# Patient Record
Sex: Male | Born: 1970 | Race: White | Hispanic: No | Marital: Single | Smoking: Current every day smoker
Health system: Southern US, Community
[De-identification: ages and names within clinical notes are randomized; demographics above are authoritative.]

## PROBLEM LIST (undated history)

## (undated) DIAGNOSIS — E119 Type 2 diabetes mellitus without complications: Secondary | ICD-10-CM

## (undated) DIAGNOSIS — I10 Essential (primary) hypertension: Secondary | ICD-10-CM

## (undated) DIAGNOSIS — T7840XA Allergy, unspecified, initial encounter: Secondary | ICD-10-CM

## (undated) DIAGNOSIS — E785 Hyperlipidemia, unspecified: Secondary | ICD-10-CM

## (undated) DIAGNOSIS — J45909 Unspecified asthma, uncomplicated: Secondary | ICD-10-CM

## (undated) HISTORY — DX: Allergy, unspecified, initial encounter: T78.40XA

## (undated) HISTORY — PX: WRIST SURGERY: SHX841

## (undated) HISTORY — PX: SHOULDER SURGERY: SHX246

## (undated) HISTORY — DX: Unspecified asthma, uncomplicated: J45.909

## (undated) HISTORY — DX: Essential (primary) hypertension: I10

## (undated) HISTORY — DX: Type 2 diabetes mellitus without complications: E11.9

## (undated) HISTORY — DX: Hyperlipidemia, unspecified: E78.5

---

## 2003-03-28 ENCOUNTER — Emergency Department (HOSPITAL_COMMUNITY): Admission: EM | Admit: 2003-03-28 | Discharge: 2003-03-28 | Payer: Self-pay | Admitting: Emergency Medicine

## 2006-03-15 ENCOUNTER — Emergency Department (HOSPITAL_COMMUNITY): Admission: EM | Admit: 2006-03-15 | Discharge: 2006-03-15 | Payer: Self-pay | Admitting: Emergency Medicine

## 2008-08-13 ENCOUNTER — Emergency Department (HOSPITAL_COMMUNITY): Admission: EM | Admit: 2008-08-13 | Discharge: 2008-08-13 | Payer: Self-pay | Admitting: Emergency Medicine

## 2008-08-14 ENCOUNTER — Emergency Department (HOSPITAL_COMMUNITY): Admission: EM | Admit: 2008-08-14 | Discharge: 2008-08-14 | Payer: Self-pay | Admitting: Emergency Medicine

## 2008-10-08 ENCOUNTER — Emergency Department: Payer: Self-pay | Admitting: Unknown Physician Specialty

## 2008-12-09 ENCOUNTER — Inpatient Hospital Stay: Payer: Self-pay | Admitting: Psychiatry

## 2009-03-02 ENCOUNTER — Inpatient Hospital Stay: Payer: Self-pay | Admitting: Psychiatry

## 2010-08-27 ENCOUNTER — Encounter: Payer: Self-pay | Admitting: *Deleted

## 2010-08-27 ENCOUNTER — Emergency Department (HOSPITAL_COMMUNITY)
Admission: EM | Admit: 2010-08-27 | Discharge: 2010-08-27 | Disposition: A | Discharge status: Home / Self Care | Payer: Self-pay | Attending: *Deleted | Admitting: *Deleted

## 2010-08-27 DIAGNOSIS — R109 Unspecified abdominal pain: Secondary | ICD-10-CM | POA: Insufficient documentation

## 2010-08-27 DIAGNOSIS — R1110 Vomiting, unspecified: Secondary | ICD-10-CM | POA: Insufficient documentation

## 2010-08-27 LAB — BASIC METABOLIC PANEL
BUN: 11 mg/dL (ref 6–23)
CO2: 24 mEq/L (ref 19–32)
Chloride: 105 mEq/L (ref 96–112)
Creatinine, Ser: 0.6 mg/dL (ref 0.50–1.35)

## 2010-08-27 LAB — DIFFERENTIAL
Basophils Absolute: 0 10*3/uL (ref 0.0–0.1)
Basophils Relative: 0 % (ref 0–1)
Eosinophils Absolute: 0.1 10*3/uL (ref 0.0–0.7)
Eosinophils Relative: 1 % (ref 0–5)
Lymphocytes Relative: 22 % (ref 12–46)
Monocytes Absolute: 0.4 10*3/uL (ref 0.1–1.0)

## 2010-08-27 LAB — CBC
HCT: 47.1 % (ref 39.0–52.0)
MCH: 32.4 pg (ref 26.0–34.0)
MCHC: 34 g/dL (ref 30.0–36.0)
MCV: 95.3 fL (ref 78.0–100.0)
RDW: 13 % (ref 11.5–15.5)

## 2010-08-27 NOTE — ED Notes (Signed)
Pt witnessed leaving by registration. Pt refused to stop on way out to sign AMA form for [facility] in Registration.

## 2010-08-27 NOTE — ED Notes (Signed)
Pt states vomiting began at 0200 this morning. Pt states he noticed a bite to right lower abdomen this morning.  States he has vomited 25 times today. Pt was lying in waiting room floor when called for triage. Pt also states pain to abdomen. A/o x 4 during triage. No active vomiting since arrival.

## 2010-08-27 NOTE — ED Notes (Signed)
Pt ambulated back out to waiting area without difficulty.

## 2010-10-29 ENCOUNTER — Inpatient Hospital Stay: Payer: Self-pay | Admitting: Psychiatry

## 2012-04-18 IMAGING — CR RIGHT HAND - COMPLETE 3+ VIEW
1 series · 3 of 3 positions shown · non-contrast
Comparison: none

REASON FOR EXAM: edema and pain.
COMMENTS:

[Series 1: view not recorded · 0.17mm/px · 3 of 3 slices shown]
[im 1/3]
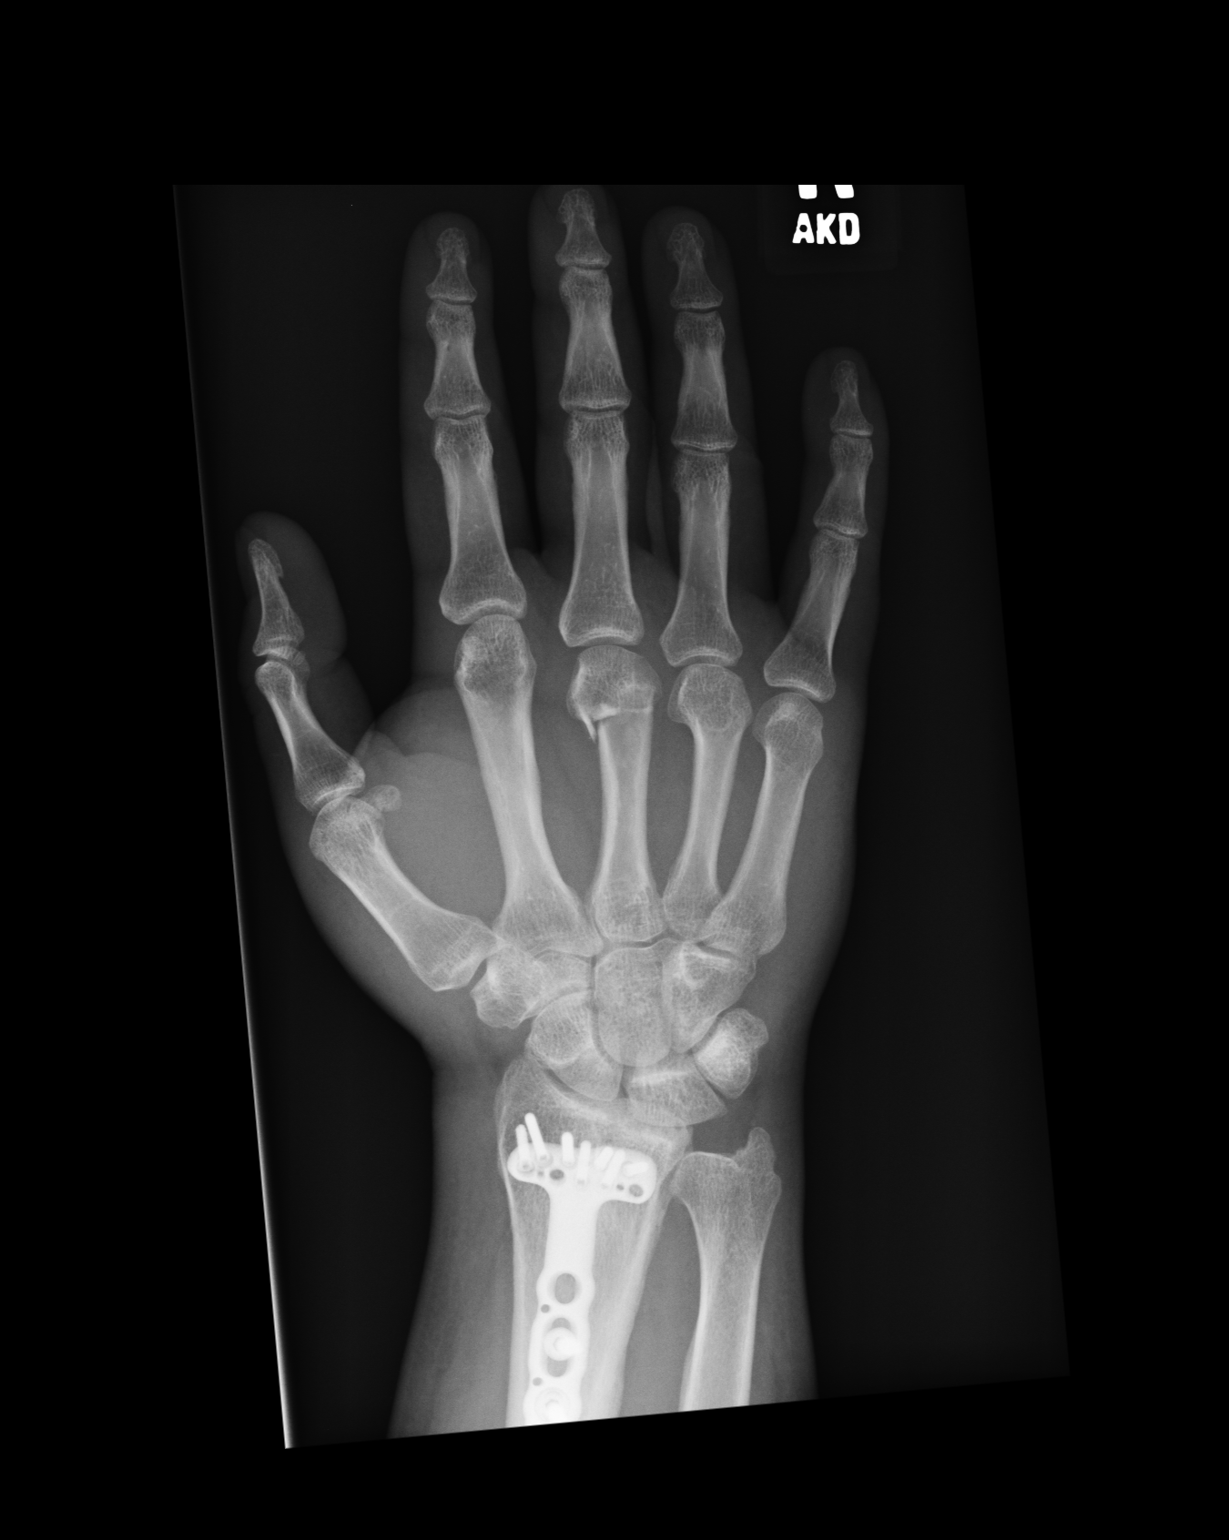
[im 2/3]
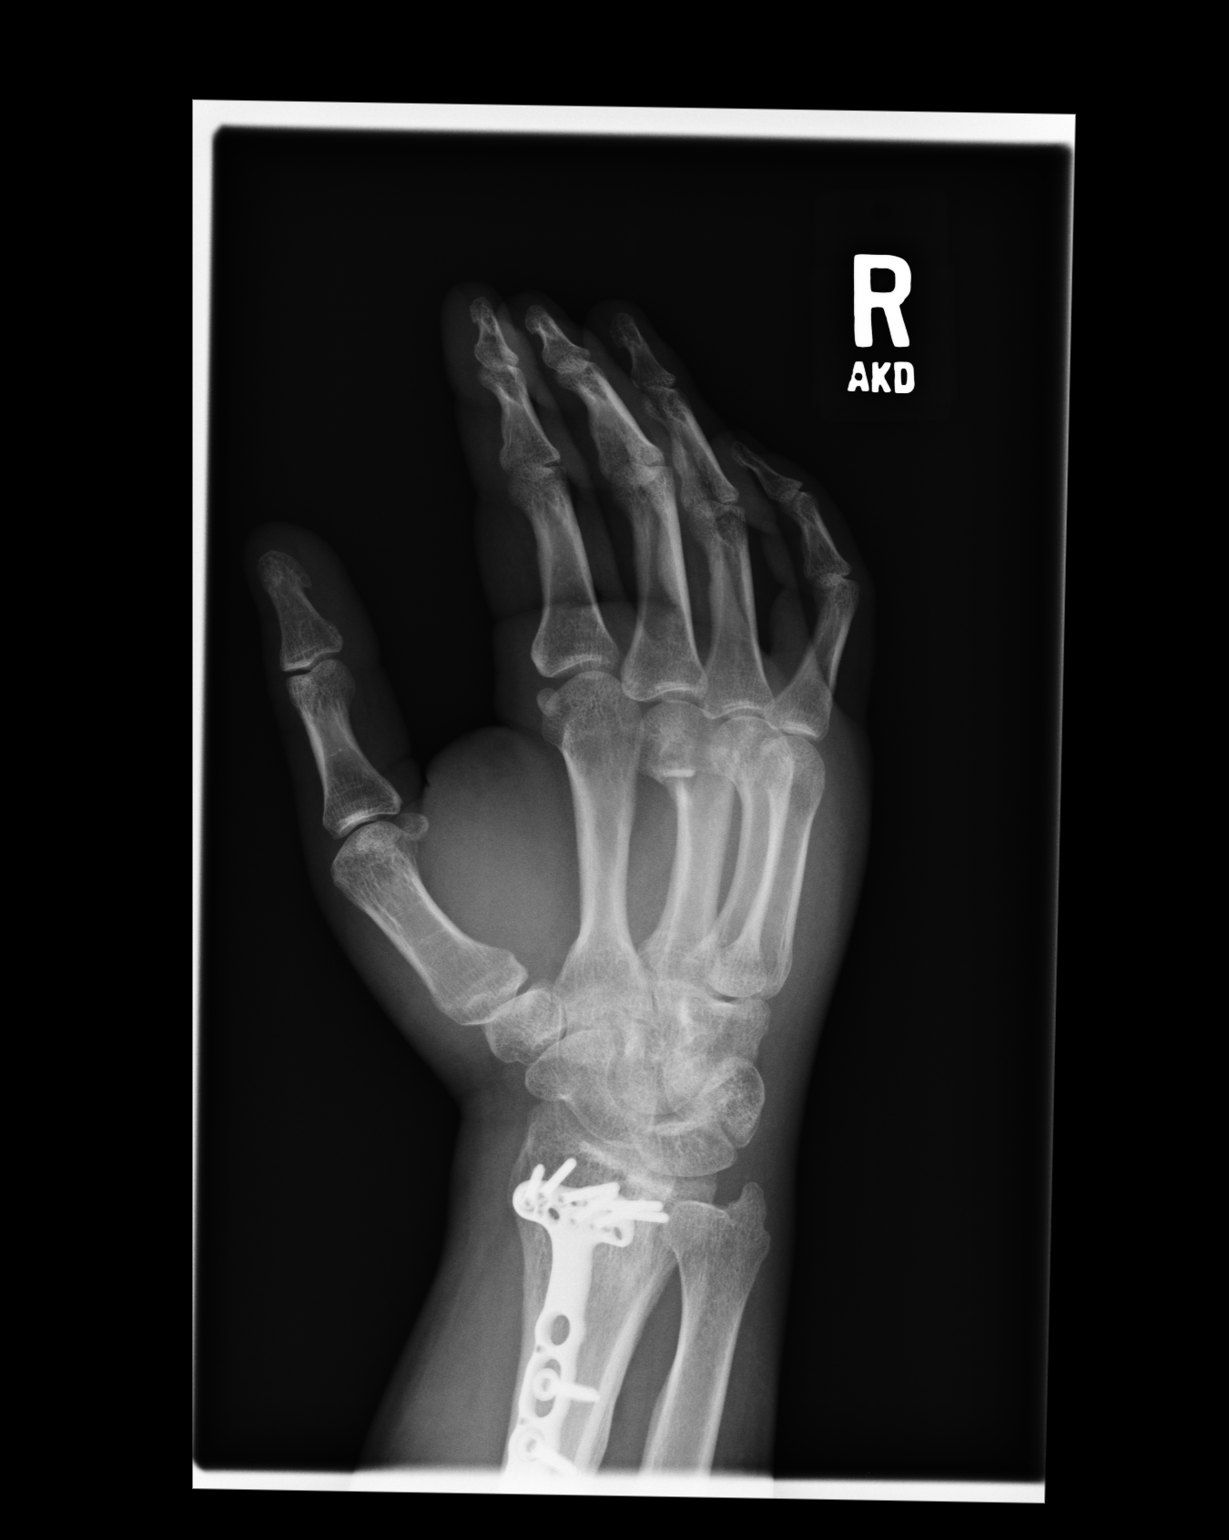
[im 3/3]
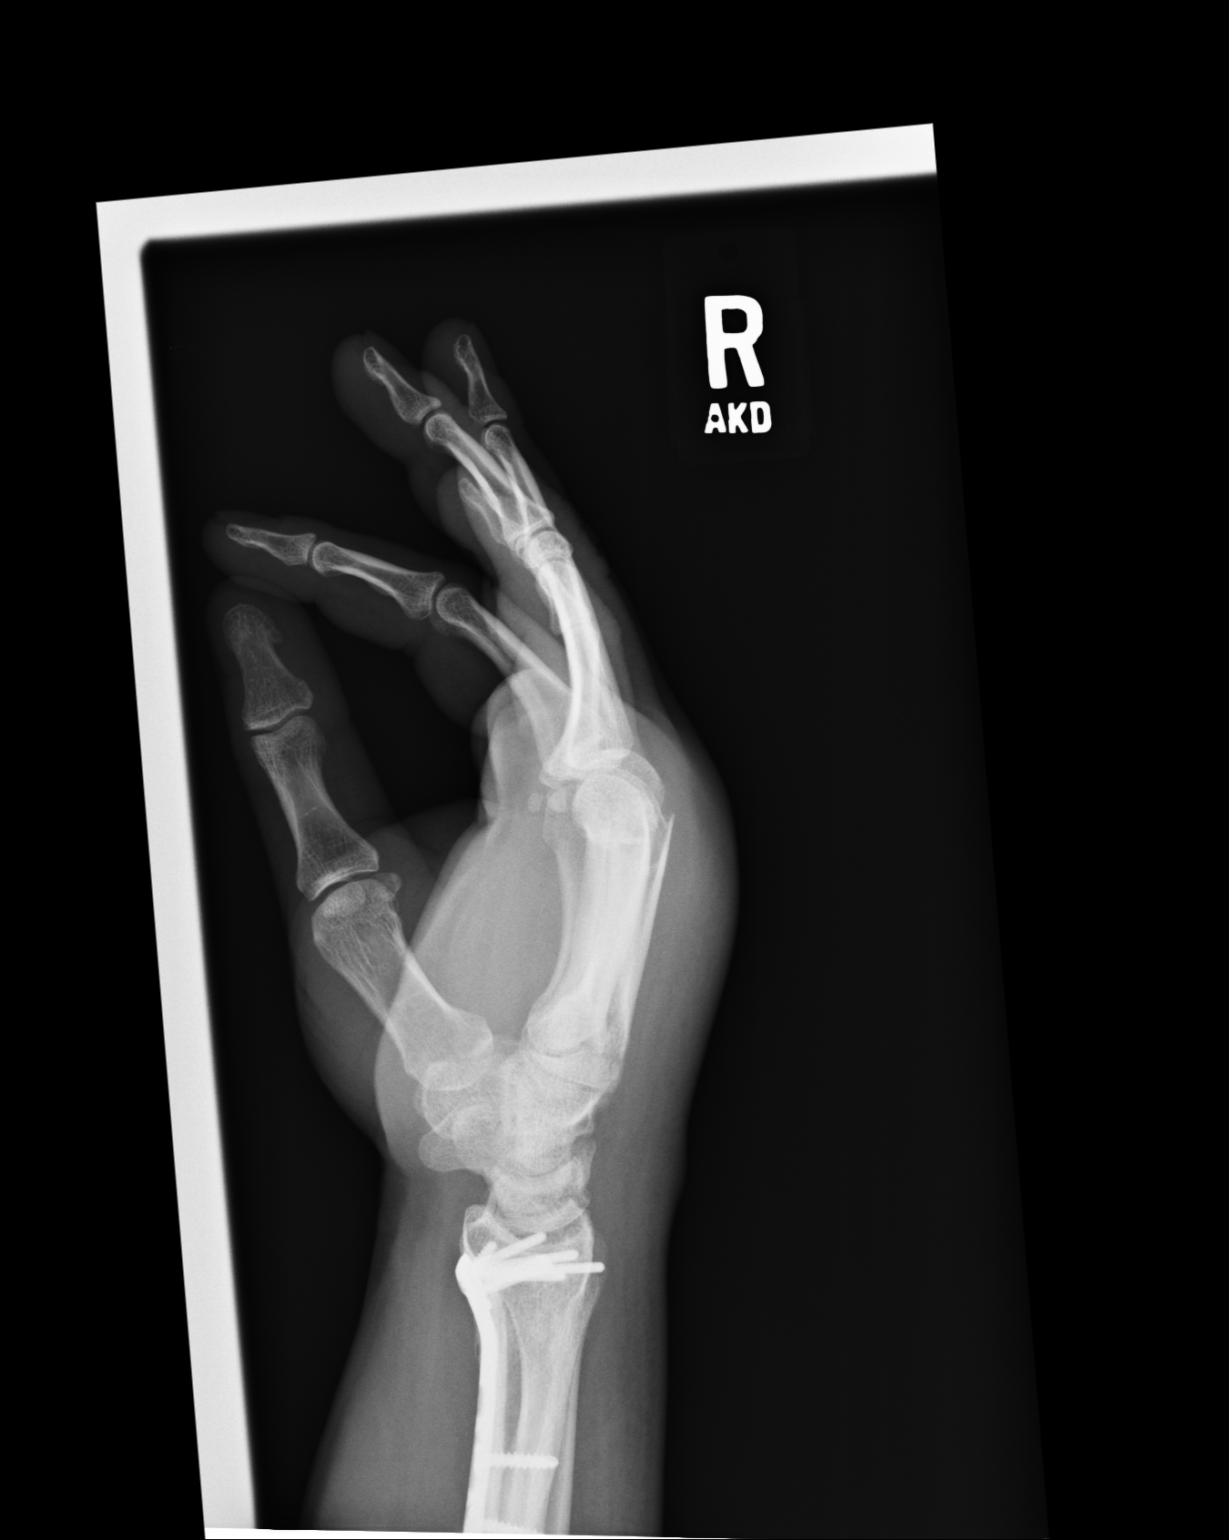

[3 of 3 positions shown; findings below may reference images not displayed]

PROCEDURE:     DXR - DXR HAND RT COMPLETE W/OBLIQUES  - October 28, 2010 [DATE]

RESULT:     There are no priors for comparisons.

A transverse impacted fracture is identified along the distal shaft of the
third metacarpal. There is a component of comminution. Dorsal displacement
is appreciated. Soft tissue swelling is appreciated along the hand. Lateral
buttress plate with screw fixation is identified along the distal radius.
Hardware appears intact without loosening.
IMPRESSION: Transverse fracture involving the shaft of the third
metacarpal, age indeterminate.

## 2019-08-23 ENCOUNTER — Ambulatory Visit (INDEPENDENT_AMBULATORY_CARE_PROVIDER_SITE_OTHER): Payer: Self-pay | Admitting: Internal Medicine

## 2019-08-23 ENCOUNTER — Encounter: Payer: Self-pay | Admitting: Internal Medicine

## 2019-08-23 ENCOUNTER — Other Ambulatory Visit: Payer: Self-pay

## 2019-08-23 VITALS — BP 128/76 | HR 88 | Temp 98.1°F | Ht 67.0 in | Wt 193.0 lb

## 2019-08-23 DIAGNOSIS — E785 Hyperlipidemia, unspecified: Secondary | ICD-10-CM

## 2019-08-23 DIAGNOSIS — F1121 Opioid dependence, in remission: Secondary | ICD-10-CM | POA: Insufficient documentation

## 2019-08-23 DIAGNOSIS — E1169 Type 2 diabetes mellitus with other specified complication: Secondary | ICD-10-CM

## 2019-08-23 DIAGNOSIS — Z8619 Personal history of other infectious and parasitic diseases: Secondary | ICD-10-CM

## 2019-08-23 DIAGNOSIS — I10 Essential (primary) hypertension: Secondary | ICD-10-CM

## 2019-08-23 DIAGNOSIS — K219 Gastro-esophageal reflux disease without esophagitis: Secondary | ICD-10-CM

## 2019-08-23 DIAGNOSIS — E118 Type 2 diabetes mellitus with unspecified complications: Secondary | ICD-10-CM | POA: Insufficient documentation

## 2019-08-23 DIAGNOSIS — N529 Male erectile dysfunction, unspecified: Secondary | ICD-10-CM

## 2019-08-23 MED ORDER — HYDROCHLOROTHIAZIDE 12.5 MG PO CAPS: 12.5000 mg | ORAL_CAPSULE | Freq: Every day | ORAL | 3 refills | Status: DC

## 2019-08-23 MED ORDER — LOSARTAN POTASSIUM 50 MG PO TABS: 50.0000 mg | ORAL_TABLET | Freq: Every day | ORAL | 3 refills | Status: DC

## 2019-08-23 MED ORDER — SILDENAFIL CITRATE 20 MG PO TABS: 20.0000 mg | ORAL_TABLET | Freq: Every day | ORAL | 0 refills | Status: DC | PRN

## 2019-08-23 MED ORDER — OMEPRAZOLE 20 MG PO CPDR: 20.0000 mg | DELAYED_RELEASE_CAPSULE | Freq: Every day | ORAL | 3 refills | Status: DC

## 2019-08-23 MED ORDER — ATORVASTATIN CALCIUM 20 MG PO TABS: 20.0000 mg | ORAL_TABLET | Freq: Every day | ORAL | 3 refills | Status: DC

## 2019-08-23 MED ORDER — METFORMIN HCL 500 MG PO TABS: 1000.0000 mg | ORAL_TABLET | Freq: Every day | ORAL | 3 refills | Status: DC

## 2019-08-23 NOTE — Progress Notes (Signed)
Date:  08/23/2019   Name:  Glenn MayerGary A Gladman   DOB:  02/23/1970   MRN:  [id number]  PT has changed his Medicaid information but does not have his new card.  Chief Complaint: Establish Care (Needs referral for Psych to discuss ADHD - Addera; ), Erectile Dysfunction (Patient wants to discuss medication to help with dysfunction. Wants to test testosterone.), and Diabetes (Needs A1C check. Has not had checked in 2 years. ) Recently released from prison after 9 years.  Living with his mother and has a job in [employer] lined up with a previous employer.  Immunization History  Administered Date(s) Administered  . Moderna SARS-COVID-2 Vaccination 04/12/2019    Erectile Dysfunction This is a new problem. The problem is unchanged. The nature of his difficulty is maintaining erection and penetration. He reports no anxiety. just released from prison and seeing an previous girlfriend. He reports his erection duration to be 1 to 5 minutes. Pertinent negatives include no dysuria or hematuria. Past treatments include nothing.  Diabetes He presents for his follow-up diabetic visit. He has type 2 diabetes mellitus. Pertinent negatives for hypoglycemia include no headaches, nervousness/anxiousness or tremors. Pertinent negatives for diabetes include no chest pain, no fatigue, no polydipsia and no polyuria. Current diabetic treatment includes oral agent (monotherapy). He is compliant with treatment all of the time. An ACE inhibitor/angiotensin II receptor blocker is being taken.  Hyperlipidemia This is a chronic problem. The problem is controlled. Pertinent negatives include no chest pain or shortness of breath. Current antihyperlipidemic treatment includes statins. The current treatment provides significant improvement of lipids.  Hypertension This is a chronic problem. The problem is controlled. Pertinent negatives include no chest pain, headaches, palpitations or shortness of breath. Past treatments include  angiotensin blockers and diuretics. The current treatment provides significant improvement.  ADHD - he reports taking adderall for several years in the past.  He stopped the medication for unknown reasons and now he believes that he really did very well when taking it.  He was able to be productive at work and maintain focus. He is interested in pursuing treatment again.  Lab Results  Component Value Date   CREATININE 0.60 08/27/2010   BUN 11 08/27/2010   NA 141 08/27/2010   K 3.9 08/27/2010   CL 105 08/27/2010   CO2 24 08/27/2010   No results found for: CHOL, HDL, LDLCALC, LDLDIRECT, TRIG, CHOLHDL No results found for: TSH No results found for: HGBA1C Lab Results  Component Value Date   WBC 9.1 08/27/2010   HGB 16.0 08/27/2010   HCT 47.1 08/27/2010   MCV 95.3 08/27/2010   PLT 449 (H) 08/27/2010   No results found for: ALT, AST, GGT, ALKPHOS, BILITOT   Review of Systems  Constitutional: Negative for appetite change, fatigue and unexpected weight change.  HENT: Negative for trouble swallowing.   Eyes: Negative for visual disturbance.  Respiratory: Negative for cough, shortness of breath and wheezing.   Cardiovascular: Negative for chest pain, palpitations and leg swelling.  Gastrointestinal: Negative for abdominal pain and blood in stool.  Endocrine: Negative for polydipsia and polyuria.  Genitourinary: Negative for discharge, dysuria, genital sores, hematuria and testicular pain.       ED  Musculoskeletal: Negative for arthralgias, gait problem and joint swelling.  Skin: Negative for color change and rash.  Allergic/Immunologic: Negative for environmental allergies.  Neurological: Negative for tremors, numbness and headaches.  Psychiatric/Behavioral: Negative for dysphoric mood and sleep disturbance. The patient is not nervous/anxious.  Patient Active Problem List   Diagnosis Date Noted  . Essential hypertension 08/23/2019  . Type II diabetes mellitus with  complication (HCC) 08/23/2019  . Hyperlipidemia associated with type 2 diabetes mellitus (HCC) 08/23/2019  . Erectile dysfunction 08/23/2019  . Gastroesophageal reflux disease 08/23/2019  . Hx of hepatitis C 08/23/2019    Allergies  Allergen Reactions  . Penicillins Anaphylaxis    Past Surgical History:  Procedure Laterality Date  . SHOULDER SURGERY    . WRIST SURGERY      Social History   Tobacco Use  . Smoking status: Former Smoker    Packs/day: 1.00    Years: 25.00    Pack years: 25.00    Types: Cigarettes    Quit date: 2014    Years since quitting: 7.5  . Smokeless tobacco: Never Used  Vaping Use  . Vaping Use: Every day  . Substances: Nicotine, Flavoring  Substance Use Topics  . Alcohol use: No  . Drug use: Not Currently     Medication list has been reviewed and updated.  Current Meds  Medication Sig  . acetaminophen (TYLENOL) 325 MG tablet Take 650 mg by mouth every 6 (six) hours as needed.  Marland Kitchen albuterol (VENTOLIN HFA) 108 (90 Base) MCG/ACT inhaler Inhale into the lungs every 6 (six) hours as needed for wheezing or shortness of breath.  Marland Kitchen aspirin 81 MG chewable tablet Chew by mouth daily.  Marland Kitchen atorvastatin (LIPITOR) 20 MG tablet Take 1 tablet (20 mg total) by mouth daily.  . buprenorphine-naloxone (SUBOXONE) 8-2 mg SUBL SL tablet Place 1 tablet under the tongue daily.  . cetirizine (ZYRTEC) 10 MG tablet Take 10 mg by mouth daily.  . fluticasone (FLONASE) 50 MCG/ACT nasal spray Place into both nostrils daily.  . hydrochlorothiazide (MICROZIDE) 12.5 MG capsule Take 1 capsule (12.5 mg total) by mouth daily.  Marland Kitchen losartan (COZAAR) 50 MG tablet Take 1 tablet (50 mg total) by mouth daily.  . metFORMIN (GLUCOPHAGE) 500 MG tablet Take 2 tablets (1,000 mg total) by mouth daily with breakfast.  . omeprazole (PRILOSEC) 20 MG capsule Take 1 capsule (20 mg total) by mouth daily.  . polycarbophil (FIBERCON) 625 MG tablet Take 625 mg by mouth in the morning and at bedtime.  .  [DISCONTINUED] atorvastatin (LIPITOR) 20 MG tablet Take 20 mg by mouth daily.  . [DISCONTINUED] hydrochlorothiazide (MICROZIDE) 12.5 MG capsule Take 12.5 mg by mouth daily.  . [DISCONTINUED] losartan (COZAAR) 50 MG tablet Take 50 mg by mouth daily.  . [DISCONTINUED] metFORMIN (GLUCOPHAGE) 500 MG tablet Take 1,000 mg by mouth daily with breakfast.  . [DISCONTINUED] omeprazole (PRILOSEC) 20 MG capsule Take 20 mg by mouth daily.    PHQ 2/9 Scores 08/23/2019  PHQ - 2 Score 0  PHQ- 9 Score 0    GAD 7 : Generalized Anxiety Score 08/23/2019  Nervous, Anxious, on Edge 0  Control/stop worrying 0  Worry too much - different things 0  Trouble relaxing 0  Restless 0  Easily annoyed or irritable 0  Afraid - awful might happen 0  Total GAD 7 Score 0  Anxiety Difficulty Not difficult at all    BP Readings from Last 3 Encounters:  08/23/19 128/76  08/27/10 127/86    Physical Exam Vitals and nursing note reviewed.  Constitutional:      General: He is not in acute distress.    Appearance: Normal appearance. He is well-developed.  HENT:     Head: Normocephalic and atraumatic.  Neck:  Vascular: No carotid bruit.  Cardiovascular:     Rate and Rhythm: Normal rate and regular rhythm.     Pulses: Normal pulses.     Heart sounds: No murmur heard.   Pulmonary:     Effort: Pulmonary effort is normal. No respiratory distress.     Breath sounds: No wheezing or rhonchi.  Abdominal:     General: Bowel sounds are normal.     Palpations: Abdomen is soft.     Tenderness: There is no abdominal tenderness.     Hernia: No hernia is present.  Musculoskeletal:        General: Normal range of motion.     Cervical back: Normal range of motion.     Right lower leg: No edema.     Left lower leg: No edema.  Lymphadenopathy:     Cervical: No cervical adenopathy.  Skin:    General: Skin is warm and dry.     Capillary Refill: Capillary refill takes less than 2 seconds.     Findings: No rash.    Neurological:     General: No focal deficit present.     Mental Status: He is alert and oriented to person, place, and time.  Psychiatric:        Mood and Affect: Mood normal.        Behavior: Behavior normal.     Wt Readings from Last 3 Encounters:  08/23/19 193 lb (87.5 kg)  08/27/10 168 lb (76.2 kg)    BP 128/76 (BP Location: Right Arm, Patient Position: Sitting, Cuff Size: Large)   Pulse 88   Temp 98.1 F (36.7 C) (Oral)   Ht 5\' 7"  (1.702 m)   Wt 193 lb (87.5 kg)   SpO2 98%   BMI 30.23 kg/m   Assessment and Plan: 1. Essential hypertension Clinically stable exam with well controlled BP. Tolerating medications without side effects at this time. Pt to continue current regimen and low sodium diet; benefits of regular exercise as able discussed. - losartan (COZAAR) 50 MG tablet; Take 1 tablet (50 mg total) by mouth daily.  Dispense: 30 tablet; Refill: 3 - hydrochlorothiazide (MICROZIDE) 12.5 MG capsule; Take 1 capsule (12.5 mg total) by mouth daily.  Dispense: 30 capsule; Refill: 3 - Comprehensive metabolic panel  2. Type II diabetes mellitus with complication (HCC) Clinically stable by exam and report without s/s of hypoglycemia. DM complicated by htn. Tolerating medications well without side effects or other concerns. - metFORMIN (GLUCOPHAGE) 500 MG tablet; Take 2 tablets (1,000 mg total) by mouth daily with breakfast.  Dispense: 60 tablet; Refill: 3 - Hemoglobin A1c  3. Hyperlipidemia associated with type 2 diabetes mellitus (HCC) Tolerating statin medication without side effects at this time Continue same therapy without change at this time. - atorvastatin (LIPITOR) 20 MG tablet; Take 1 tablet (20 mg total) by mouth daily.  Dispense: 30 tablet; Refill: 3 - Lipid panel  4. Erectile dysfunction, unspecified erectile dysfunction type Prescribed generic sildenafil 20 mg - adjust dose as needed Usual warnings and precautions given - sildenafil (REVATIO) 20 MG tablet;  Take 1-3 tablets (20-60 mg total) by mouth daily as needed.  Dispense: 30 tablet; Refill: 0 - Testosterone  5. Gastroesophageal reflux disease, unspecified whether esophagitis present With hx of gerd and hep C he likely needs an EGD He is also due for a colonoscopy so once he gets his medicaid card, will refer to GI - omeprazole (PRILOSEC) 20 MG capsule; Take 1 capsule (20 mg total) by  mouth daily.  Dispense: 30 capsule; Refill: 3 - CBC with Differential/Platelet  6. Hx of hepatitis C Treated and cured while incarcerated Per pt, several follow up labs were negative for Hep C antigen  7. Narcotic dependence, in remission (HCC) On Suboxone therapy - from a [employer] group He says that he has a form to be signed by PCP then his insurance can be billed for services He will need to show me that form - I have advised him that I do not prescribe Suboxone I also do not prescribe Adderall.   Partially dictated using Animal nutritionist. Any errors are unintentional.  Bari Edward, MD [facility]  08/23/2019

## 2019-08-30 ENCOUNTER — Other Ambulatory Visit: Payer: Self-pay | Admitting: Internal Medicine

## 2019-08-30 ENCOUNTER — Telehealth: Payer: Self-pay

## 2019-08-30 ENCOUNTER — Telehealth: Payer: Self-pay | Admitting: Internal Medicine

## 2019-08-30 DIAGNOSIS — I10 Essential (primary) hypertension: Secondary | ICD-10-CM

## 2019-08-30 DIAGNOSIS — K219 Gastro-esophageal reflux disease without esophagitis: Secondary | ICD-10-CM

## 2019-08-30 DIAGNOSIS — N529 Male erectile dysfunction, unspecified: Secondary | ICD-10-CM

## 2019-08-30 DIAGNOSIS — E118 Type 2 diabetes mellitus with unspecified complications: Secondary | ICD-10-CM

## 2019-08-30 DIAGNOSIS — E785 Hyperlipidemia, unspecified: Secondary | ICD-10-CM

## 2019-08-30 MED ORDER — SILDENAFIL CITRATE 20 MG PO TABS: 20.0000 mg | ORAL_TABLET | Freq: Every day | ORAL | 0 refills | Status: DC | PRN

## 2019-08-30 MED ORDER — METFORMIN HCL 500 MG PO TABS: 1000.0000 mg | ORAL_TABLET | Freq: Every day | ORAL | 3 refills | Status: DC

## 2019-08-30 MED ORDER — ATORVASTATIN CALCIUM 20 MG PO TABS: 20.0000 mg | ORAL_TABLET | Freq: Every day | ORAL | 3 refills | Status: DC

## 2019-08-30 MED ORDER — HYDROCHLOROTHIAZIDE 12.5 MG PO CAPS: 12.5000 mg | ORAL_CAPSULE | Freq: Every day | ORAL | 3 refills | Status: DC

## 2019-08-30 MED ORDER — OMEPRAZOLE 20 MG PO CPDR: 20.0000 mg | DELAYED_RELEASE_CAPSULE | Freq: Every day | ORAL | 3 refills | Status: DC

## 2019-08-30 MED ORDER — LOSARTAN POTASSIUM 50 MG PO TABS: 50.0000 mg | ORAL_TABLET | Freq: Every day | ORAL | 3 refills | Status: DC

## 2019-08-30 NOTE — Telephone Encounter (Signed)
Patient's mother calling to inquire if Dr. Judithann Graves would be able to send a medication for cough and headache. She declined appointment for patient due to patient starting a new job today. Please advise.

## 2019-08-30 NOTE — Telephone Encounter (Signed)
Called and left pt a VM letting him know that he needs to get his labs done per Dr. Judithann Graves. Told him if he has any issues with the lab to give [place] a call because there are two labs he can have his labs drawn at.  CRM created for [facility].  CM

## 2019-08-30 NOTE — Telephone Encounter (Signed)
LVM with patient to that he will need to be seen in the office for his symptoms and if he can not make an appointment I recommend that he walk in to urgent care.

## 2019-08-30 NOTE — Telephone Encounter (Signed)
Noted  KP 

## 2019-08-30 NOTE — Telephone Encounter (Signed)
Medication: atorvastatin (LIPITOR) 20 MG tablet [[id number]] , hydrochlorothiazide (MICROZIDE) 12.5 MG capsule [[id number]] , losartan (COZAAR) 50 MG tablet [[id number]], metFORMIN (GLUCOPHAGE) 500 MG tablet [[id number]] , omeprazole (PRILOSEC) 20 MG capsule [[id number]] , sildenafil (REVATIO) 20 MG tablet [[id number]]   Can the above medication be resent to the below pharmacy please?  Has the patient contacted their pharmacy? Yes  (Agent: If no, request that the patient contact the pharmacy for the refill.) (Agent: If yes, when and what did the pharmacy advise?)  Preferred Pharmacy (with phone number or street name): [facility] [address] [place]. [address] [phone]  Agent: Please be advised that RX refills may take up to 3 business days. We ask that you follow-up with your pharmacy.

## 2019-09-27 ENCOUNTER — Other Ambulatory Visit: Payer: Self-pay

## 2019-09-27 ENCOUNTER — Encounter: Payer: Self-pay | Admitting: Internal Medicine

## 2019-09-27 ENCOUNTER — Ambulatory Visit (INDEPENDENT_AMBULATORY_CARE_PROVIDER_SITE_OTHER): Payer: 59 | Admitting: Internal Medicine

## 2019-09-27 VITALS — BP 126/72 | HR 71 | Temp 98.4°F | Ht 67.0 in | Wt 195.0 lb

## 2019-09-27 DIAGNOSIS — I10 Essential (primary) hypertension: Secondary | ICD-10-CM | POA: Diagnosis not present

## 2019-09-27 DIAGNOSIS — N529 Male erectile dysfunction, unspecified: Secondary | ICD-10-CM

## 2019-09-27 DIAGNOSIS — K5904 Chronic idiopathic constipation: Secondary | ICD-10-CM | POA: Diagnosis not present

## 2019-09-27 DIAGNOSIS — F909 Attention-deficit hyperactivity disorder, unspecified type: Secondary | ICD-10-CM | POA: Diagnosis not present

## 2019-09-27 DIAGNOSIS — F1121 Opioid dependence, in remission: Secondary | ICD-10-CM

## 2019-09-27 DIAGNOSIS — E118 Type 2 diabetes mellitus with unspecified complications: Secondary | ICD-10-CM

## 2019-09-27 DIAGNOSIS — E785 Hyperlipidemia, unspecified: Secondary | ICD-10-CM

## 2019-09-27 DIAGNOSIS — E1169 Type 2 diabetes mellitus with other specified complication: Secondary | ICD-10-CM

## 2019-09-27 MED ORDER — LUBIPROSTONE 24 MCG PO CAPS: 24.0000 ug | ORAL_CAPSULE | Freq: Two times a day (BID) | ORAL | 0 refills | Status: DC

## 2019-09-27 NOTE — Progress Notes (Signed)
Date:  09/27/2019   Name:  Glenn Klein   DOB:  09-18-1970   MRN:  [id number]   Chief Complaint: Referral (psychiatrist to get adderral, [facility] [place] ) and Medication Problem (suboxone making pt constipated since he started it)  Constipation This is a new problem. The current episode started more than 1 month ago. The problem is unchanged. His stool frequency is 2 to 3 times per week. The stool is described as pellet like. The patient is on a high fiber diet. He exercises regularly. There has been adequate water intake. Associated symptoms include bloating. Pertinent negatives include no abdominal pain, fecal incontinence, fever, vomiting or weight loss. Risk factors: since starting suboxone. He has tried enemas, laxatives, fiber and stool softeners for the symptoms. The treatment provided no relief.  Psych referral - wants to see someone about possible ADHD. We discussed this last visit and he aware that I do not prescribe Adderall which is what he taken in the past. Narcotic addiction - he is taking Suboxone from an on-line practice.  He does monthly videos and then picks up the medication at [facility].  The medication is affordable but the visits are costing over $100 per month.  Lab Results  Component Value Date   CREATININE 0.60 08/27/2010   BUN 11 08/27/2010   NA 141 08/27/2010   K 3.9 08/27/2010   CL 105 08/27/2010   CO2 24 08/27/2010   No results found for: CHOL, HDL, LDLCALC, LDLDIRECT, TRIG, CHOLHDL No results found for: TSH No results found for: HGBA1C Lab Results  Component Value Date   WBC 9.1 08/27/2010   HGB 16.0 08/27/2010   HCT 47.1 08/27/2010   MCV 95.3 08/27/2010   PLT 449 (H) 08/27/2010   No results found for: ALT, AST, GGT, ALKPHOS, BILITOT   Review of Systems  Constitutional: Negative for appetite change, fatigue, fever, unexpected weight change and weight loss.  HENT: Negative for trouble swallowing.   Eyes: Negative for visual  disturbance.  Respiratory: Negative for cough, shortness of breath and wheezing.   Cardiovascular: Negative for chest pain, palpitations and leg swelling.  Gastrointestinal: Positive for bloating and constipation. Negative for abdominal pain, blood in stool and vomiting.  Endocrine: Negative for polydipsia and polyuria.  Genitourinary: Negative for discharge, dysuria, genital sores, hematuria and testicular pain.       ED  Musculoskeletal: Negative for arthralgias, gait problem and joint swelling.  Skin: Negative for color change and rash.  Allergic/Immunologic: Negative for environmental allergies.  Neurological: Negative for tremors, numbness and headaches.  Psychiatric/Behavioral: Negative for dysphoric mood and sleep disturbance. The patient is not nervous/anxious.     Patient Active Problem List   Diagnosis Date Noted  . Essential hypertension 08/23/2019  . Type II diabetes mellitus with complication (HCC) 08/23/2019  . Hyperlipidemia associated with type 2 diabetes mellitus (HCC) 08/23/2019  . Erectile dysfunction 08/23/2019  . Gastroesophageal reflux disease 08/23/2019  . Hx of hepatitis C 08/23/2019  . Narcotic dependence, in remission (HCC) 08/23/2019    Allergies  Allergen Reactions  . Penicillins Anaphylaxis    Past Surgical History:  Procedure Laterality Date  . SHOULDER SURGERY    . WRIST SURGERY      Social History   Tobacco Use  . Smoking status: Current Some Day Smoker    Packs/day: 1.00    Years: 25.00    Pack years: 25.00    Types: Cigarettes    Last attempt to quit:  2014    Years since quitting: 7.6  . Smokeless tobacco: Never Used  . Tobacco comment: every once and a while  Vaping Use  . Vaping Use: Every day  . Substances: Nicotine, Flavoring  Substance Use Topics  . Alcohol use: No  . Drug use: Not Currently     Medication list has been reviewed and updated.  Current Meds  Medication Sig  . acetaminophen (TYLENOL) 325 MG tablet Take  650 mg by mouth every 6 (six) hours as needed.  Marland Kitchen albuterol (VENTOLIN HFA) 108 (90 Base) MCG/ACT inhaler Inhale into the lungs every 6 (six) hours as needed for wheezing or shortness of breath.  Marland Kitchen aspirin 81 MG chewable tablet Chew by mouth daily.  Marland Kitchen atorvastatin (LIPITOR) 20 MG tablet Take 1 tablet (20 mg total) by mouth daily.  . buprenorphine-naloxone (SUBOXONE) 8-2 mg SUBL SL tablet Place 1 tablet under the tongue daily.  . cetirizine (ZYRTEC) 10 MG tablet Take 10 mg by mouth daily.  . fluticasone (FLONASE) 50 MCG/ACT nasal spray Place into both nostrils daily.  . hydrochlorothiazide (MICROZIDE) 12.5 MG capsule Take 1 capsule (12.5 mg total) by mouth daily.  Marland Kitchen losartan (COZAAR) 50 MG tablet Take 1 tablet (50 mg total) by mouth daily.  . metFORMIN (GLUCOPHAGE) 500 MG tablet Take 2 tablets (1,000 mg total) by mouth daily with breakfast.  . omeprazole (PRILOSEC) 20 MG capsule Take 1 capsule (20 mg total) by mouth daily.  . Psyllium (METAMUCIL PO) Take 1 tablet by mouth daily.  . sildenafil (REVATIO) 20 MG tablet Take 1-3 tablets (20-60 mg total) by mouth daily as needed.  . [DISCONTINUED] polycarbophil (FIBERCON) 625 MG tablet Take 625 mg by mouth in the morning and at bedtime.    PHQ 2/9 Scores 08/23/2019  PHQ - 2 Score 0  PHQ- 9 Score 0    GAD 7 : Generalized Anxiety Score 08/23/2019  Nervous, Anxious, on Edge 0  Control/stop worrying 0  Worry too much - different things 0  Trouble relaxing 0  Restless 0  Easily annoyed or irritable 0  Afraid - awful might happen 0  Total GAD 7 Score 0  Anxiety Difficulty Not difficult at all    BP Readings from Last 3 Encounters:  09/27/19 126/72  08/23/19 128/76  08/27/10 127/86    Physical Exam Vitals and nursing note reviewed.  Constitutional:      General: He is not in acute distress.    Appearance: He is well-developed.  HENT:     Head: Normocephalic and atraumatic.  Cardiovascular:     Rate and Rhythm: Normal rate and regular  rhythm.     Pulses: Normal pulses.     Heart sounds: No murmur heard.   Pulmonary:     Effort: Pulmonary effort is normal. No respiratory distress.     Breath sounds: No wheezing or rhonchi.  Abdominal:     General: There is distension.     Palpations: Abdomen is soft.     Tenderness: There is no abdominal tenderness. There is no guarding or rebound.  Musculoskeletal:        General: Normal range of motion.     Right lower leg: No edema.     Left lower leg: No edema.  Skin:    General: Skin is warm and dry.     Findings: No rash.  Neurological:     General: No focal deficit present.     Mental Status: He is alert and oriented to person, place,  and time.  Psychiatric:        Mood and Affect: Mood normal.        Behavior: Behavior normal.     Wt Readings from Last 3 Encounters:  09/27/19 195 lb (88.5 kg)  08/23/19 193 lb (87.5 kg)  08/27/10 168 lb (76.2 kg)    BP 126/72   Pulse 71   Temp 98.4 F (36.9 C) (Oral)   Ht 5\' 7"  (1.702 m)   Wt 195 lb (88.5 kg)   SpO2 96%   BMI 30.54 kg/m   Assessment and Plan: 1. Chronic idiopathic constipation He has failed stool softeners, fiber supplements, Metamucil and laxatives. - lubiprostone (AMITIZA) 24 MCG capsule; Take 1 capsule (24 mcg total) by mouth 2 (two) times daily with a meal.  Dispense: 60 capsule; Refill: 0  2. adhd suspected - Ambulatory referral to Psychiatry  3. Narcotic dependence, in remission (HCC) On Suboxone - he wants to find a local clinic for services to save money He says that the [facility] does have suboxone services so will look into that  4. Essential hypertension controlled - CBC with Differential/Platelet - Comprehensive metabolic panel  5. Type II diabetes mellitus with complication (HCC) On oral medications without issues at this time - Hemoglobin A1c  6. Hyperlipidemia associated with type 2 diabetes mellitus (HCC) On statin therapy  7. Erectile dysfunction, unspecified erectile  dysfunction type Hx of low T in the past - return for AM fasting labs - Testosterone   Partially dictated using [facility]. Any errors are unintentional.  Animal nutritionist, MD [facility]  09/27/2019

## 2019-09-28 ENCOUNTER — Telehealth: Payer: Self-pay | Admitting: Internal Medicine

## 2019-09-28 NOTE — Telephone Encounter (Signed)
Copied from CRM [id number]. Topic: Referral - Status >> Sep 28, 2019  9:53 AM Crist Infante wrote: Reason for CRM: [facility] called to let you know they CANNOT accept the pt.  They do not accept [facility] insurance anymore.  She said they received fax yesterday from your office for referral

## 2019-09-28 NOTE — Telephone Encounter (Signed)
Will route note to the referrals team to send to another psychiatrist.

## 2019-09-28 NOTE — Telephone Encounter (Signed)
Forwarded CRM to Maye Hides for referral change.  CM

## 2019-10-06 ENCOUNTER — Telehealth: Payer: Self-pay

## 2019-10-06 NOTE — Telephone Encounter (Signed)
Called and left patient a VM to remind him to have his blood work done from his recent appt. Told him to call back with any questions or concerns.  CM

## 2019-10-09 ENCOUNTER — Other Ambulatory Visit: Payer: Self-pay | Admitting: Internal Medicine

## 2019-10-09 DIAGNOSIS — N529 Male erectile dysfunction, unspecified: Secondary | ICD-10-CM

## 2019-10-09 NOTE — Telephone Encounter (Signed)
Requested Prescriptions  Pending Prescriptions Disp Refills  . sildenafil (REVATIO) 20 MG tablet [Pharmacy Med Name: SILDENAFIL CITRATE 20 MG TAB] 30 tablet 0    Sig: TAKE 1 TO 3 TABLETS EVERY DAY AS NEEDED     Urology: Erectile Dysfunction Agents Passed - 10/09/2019 11:24 AM      Passed - Last BP in normal range    BP Readings from Last 1 Encounters:  09/27/19 126/72         Passed - Valid encounter within last 12 months    Recent Outpatient Visits          1 week ago Chronic idiopathic constipation   [facility] Reubin Milan, MD   1 month ago Essential hypertension   [facility] Reubin Milan, MD

## 2019-10-11 ENCOUNTER — Other Ambulatory Visit: Payer: Self-pay | Admitting: Internal Medicine

## 2019-10-11 ENCOUNTER — Telehealth: Payer: Self-pay | Admitting: Internal Medicine

## 2019-10-11 DIAGNOSIS — N529 Male erectile dysfunction, unspecified: Secondary | ICD-10-CM

## 2019-10-11 MED ORDER — TADALAFIL 20 MG PO TABS: 20.0000 mg | ORAL_TABLET | Freq: Every day | ORAL | 0 refills | Status: DC | PRN

## 2019-10-11 NOTE — Telephone Encounter (Signed)
Please Advise. Last RF for sildenafil was 10/09/2019. Last office visit was 09/27/2019.  KP

## 2019-10-11 NOTE — Telephone Encounter (Signed)
Called pt he said that he has tried taking 5 of the 20 mg sildenafil. He went and got his labs drawn today. Pt requested if you could not  give him a higher dosage that he would also try Viagra 100 mg. Pt stated his insurance doesn't cover it so he has to pay out of pocket.   KP

## 2019-10-11 NOTE — Telephone Encounter (Signed)
Did he try up to 5 of the 20 mg sildenafil?  If not, he does need to do that first.   He also needs to get his blood drawn.

## 2019-10-11 NOTE — Telephone Encounter (Signed)
100 mg is the max dose of viagra and sildenafil.  I can't go any higher.  If he wants to try something else, I can send in Cialis 20 mg (also the max dose)

## 2019-10-11 NOTE — Telephone Encounter (Signed)
Pt wants to try Cialis 20mg  and wants it sent to [facility] in [place].  KP

## 2019-10-11 NOTE — Telephone Encounter (Signed)
Pt was told a different dose or something else could be called in if generic didn't work. He wants a stronger dose and more refills of Viagra and not generic sent to [facility]  sildenafil (REVATIO) 20 MG tablet [[id number]

## 2019-10-12 LAB — CBC WITH DIFFERENTIAL/PLATELET
Basophils Absolute: 0 10*3/uL (ref 0.0–0.2)
Basos: 0 %
EOS (ABSOLUTE): 0.1 10*3/uL (ref 0.0–0.4)
Eos: 1 %
Hematocrit: 42.7 % (ref 37.5–51.0)
Hemoglobin: 14.5 g/dL (ref 13.0–17.7)
Immature Grans (Abs): 0 10*3/uL (ref 0.0–0.1)
Immature Granulocytes: 0 %
Lymphocytes Absolute: 2 10*3/uL (ref 0.7–3.1)
Lymphs: 23 %
MCH: 30.6 pg (ref 26.6–33.0)
MCHC: 34 g/dL (ref 31.5–35.7)
MCV: 90 fL (ref 79–97)
Monocytes Absolute: 0.4 10*3/uL (ref 0.1–0.9)
Monocytes: 5 %
Neutrophils Absolute: 6.2 10*3/uL (ref 1.4–7.0)
Neutrophils: 71 %
Platelets: 305 10*3/uL (ref 150–450)
RBC: 4.74 x10E6/uL (ref 4.14–5.80)
RDW: 12.2 % (ref 11.6–15.4)
WBC: 8.8 10*3/uL (ref 3.4–10.8)

## 2019-10-12 LAB — COMPREHENSIVE METABOLIC PANEL
ALT: 21 IU/L (ref 0–44)
AST: 26 IU/L (ref 0–40)
Albumin/Globulin Ratio: 1.8 (ref 1.2–2.2)
Albumin: 4.6 g/dL (ref 3.8–4.9)
Alkaline Phosphatase: 87 IU/L (ref 48–121)
BUN/Creatinine Ratio: 18 (ref 9–20)
BUN: 14 mg/dL (ref 6–24)
Bilirubin Total: 0.4 mg/dL (ref 0.0–1.2)
CO2: 18 mmol/L — ABNORMAL LOW (ref 20–29)
Calcium: 9.5 mg/dL (ref 8.7–10.2)
Chloride: 101 mmol/L (ref 96–106)
Creatinine, Ser: 0.79 mg/dL (ref 0.76–1.27)
GFR calc Af Amer: 120 mL/min/{1.73_m2} (ref >59)
GFR calc non Af Amer: 104 mL/min/{1.73_m2} (ref >59)
Globulin, Total: 2.6 g/dL (ref 1.5–4.5)
Glucose: 95 mg/dL (ref 65–99)
Potassium: 4.4 mmol/L (ref 3.5–5.2)
Sodium: 142 mmol/L (ref 134–144)
Total Protein: 7.2 g/dL (ref 6.0–8.5)

## 2019-10-12 LAB — TESTOSTERONE: Testosterone: 245 ng/dL — ABNORMAL LOW (ref 264–916)

## 2019-10-12 LAB — HEMOGLOBIN A1C
Est. average glucose Bld gHb Est-mCnc: 128 mg/dL
Hgb A1c MFr Bld: 6.1 % — ABNORMAL HIGH (ref 4.8–5.6)

## 2019-10-19 ENCOUNTER — Telehealth: Payer: Self-pay | Admitting: Internal Medicine

## 2019-10-19 NOTE — Telephone Encounter (Unsigned)
Copied from CRM [id number]. Topic: Quick Communication - See Telephone Encounter >> Oct 19, 2019 11:10 AM Aretta Nip wrote: CRM for notification. See Telephone encounter for: 10/19/19.PT had not received a call back on new referral since [facility] was not accepting ins, Meanwhile has found out about "Prevagil" and was wanting to know if Dr Senaida Lange nurse could ask if maybe Dr B could prescribe that vs having to go to [facility]. FU with pt at [phone]

## 2019-10-20 NOTE — Telephone Encounter (Signed)
Pt is checking to see if dr berglund can write rx for prevagil. Pt is aware a referral was sent to referral team

## 2019-10-20 NOTE — Telephone Encounter (Signed)
Sent message to the referrals team to check on referral for patient.   CM

## 2019-10-24 ENCOUNTER — Other Ambulatory Visit: Payer: Self-pay | Admitting: Internal Medicine

## 2019-10-24 DIAGNOSIS — K5904 Chronic idiopathic constipation: Secondary | ICD-10-CM

## 2019-10-24 NOTE — Telephone Encounter (Signed)
Requested Prescriptions  Pending Prescriptions Disp Refills   lubiprostone (AMITIZA) 24 MCG capsule [Pharmacy Med Name: LUBIPROSTONE CAPSULES] 60 capsule 0    Sig: TAKE 1 CAPSULE(24 MCG) BY MOUTH TWICE DAILY WITH A MEAL     Gastroenterology: Irritable Bowel Syndrome Passed - 10/24/2019  9:00 AM      Passed - Valid encounter within last 12 months    Recent Outpatient Visits          3 weeks ago Chronic idiopathic constipation   [facility] Reubin Milan, MD   2 months ago Essential hypertension   [facility] Reubin Milan, MD

## 2019-10-26 ENCOUNTER — Other Ambulatory Visit: Payer: Self-pay

## 2019-10-26 ENCOUNTER — Other Ambulatory Visit: Payer: 59

## 2019-10-26 ENCOUNTER — Telehealth: Payer: Self-pay

## 2019-10-26 DIAGNOSIS — F909 Attention-deficit hyperactivity disorder, unspecified type: Secondary | ICD-10-CM

## 2019-10-26 DIAGNOSIS — R7989 Other specified abnormal findings of blood chemistry: Secondary | ICD-10-CM

## 2019-10-26 NOTE — Telephone Encounter (Signed)
Realized patient was Dr Karn Cassis patient instead. Sent message to Maye Hides with referrals to review this for the patient.  CM

## 2019-10-26 NOTE — Telephone Encounter (Signed)
Patient came in the office this morning to get his labs drawn for testosterone. He said he was supposed to have 2 referrals sent in for him at his last appt, and he has not heard anything from either referral.  I told him I would let you know. Please advise.    CM

## 2019-10-27 ENCOUNTER — Telehealth: Payer: Self-pay

## 2019-10-27 LAB — TESTOSTERONE: Testosterone: 311 ng/dL (ref 264–916)

## 2019-10-27 NOTE — Telephone Encounter (Signed)
Patient came into the office yesterday and asked if we could check on the psychiatry referral that was sent out at his most recent appt.   Spoke with - Dorcas Mcmurray - with the referrals team and she said the patient can call and schedule his appt with [facility] since the referral was resent yesterday. Called and informed the patient of this. Told him to call and schedule and gave him their office number. He verbalized understanding.   CM

## 2019-10-28 ENCOUNTER — Telehealth: Payer: Self-pay | Admitting: Internal Medicine

## 2019-10-28 NOTE — Telephone Encounter (Unsigned)
Copied from CRM [id number]. Topic: General - Other >> Oct 28, 2019  9:40 AM Dalphine Handing A wrote: [facility] called in regards to patients referral and stated they will not be able to see patient  due to them not accepting [facility]. Please advise

## 2019-10-28 NOTE — Telephone Encounter (Signed)
Please advise what to do next?

## 2019-10-29 ENCOUNTER — Telehealth: Payer: Self-pay | Admitting: Internal Medicine

## 2019-10-29 NOTE — Telephone Encounter (Signed)
He has already tried the 100 mg sildenafil (viagra) and said it did not work,  that is why I gave Cialis.  Now he is saying that cialis does not work for him but Viagra does.  I will not prescribe any further medication.  If his testosterone is low, I will send him to Urology for both problems.

## 2019-10-29 NOTE — Telephone Encounter (Signed)
Please call pt and tell him that changes on med can only be made by his provider and she in not in.

## 2019-10-29 NOTE — Telephone Encounter (Signed)
Patient is calling back to check on the status of the below request. Patient was advised that medication request can take up to 3 business days. Patient states he was going out of town now and wanted the medication.

## 2019-10-29 NOTE — Telephone Encounter (Signed)
Pt called back asking if he good get the rx of the viagra before the end of the day.  He is going out of town and would like to have it with him.  He states the cialis does not work but the viagra does.  [facility] on [address] and [address]

## 2019-10-29 NOTE — Telephone Encounter (Signed)
Patient is calling to report that he has been taking tadalafil (CIALIS) 20 MG tablet [[id number]] for one week with no improvement. Patient reports that he took his friend's Viagra 100mg . Patient noticed immediate results. Patient is requesting a script for Viagra 100mg . Patient reports that he has plans this weekend and would like the script before the end of the day please. Patient took test for Testerone levels. The first test results the Testerone was low, and the second one was normal. But he was not taking the Cialis on the first test. He took the Cialis on the second. Patient wants to know does Dr. want the test retaken? Please advise Cb- [phone] Preferred Pharmacy- [address] [place], [address]

## 2019-11-01 ENCOUNTER — Other Ambulatory Visit: Payer: Self-pay

## 2019-11-01 ENCOUNTER — Other Ambulatory Visit: Payer: Self-pay | Admitting: Internal Medicine

## 2019-11-01 ENCOUNTER — Telehealth: Payer: Self-pay

## 2019-11-01 DIAGNOSIS — F1121 Opioid dependence, in remission: Secondary | ICD-10-CM

## 2019-11-01 DIAGNOSIS — N529 Male erectile dysfunction, unspecified: Secondary | ICD-10-CM

## 2019-11-01 DIAGNOSIS — R7989 Other specified abnormal findings of blood chemistry: Secondary | ICD-10-CM

## 2019-11-01 NOTE — Telephone Encounter (Signed)
Sent message to [facility] with the referrals team to check on referral and to send to either [facility] or [facility].   CM

## 2019-11-01 NOTE — Telephone Encounter (Signed)
error 

## 2019-11-01 NOTE — Telephone Encounter (Signed)
Called pt he is scheduled tomorrow to get his testosterone levels recheck. Told pt if his levels is low we will send a RF Urology. Pt stated he wants a RF sent to pain clinic suboxone. [facility] fax [phone].   KP

## 2019-11-01 NOTE — Telephone Encounter (Signed)
Patient has information to call Glenn Klein and schedule an appt since [facility] also does not take [facility].

## 2019-11-02 ENCOUNTER — Other Ambulatory Visit: Payer: Self-pay

## 2019-11-02 ENCOUNTER — Ambulatory Visit
Admission: EM | Admit: 2019-11-02 | Discharge: 2019-11-02 | Disposition: A | Discharge status: Home / Self Care | Payer: 59 | Attending: Family Medicine | Admitting: Family Medicine

## 2019-11-02 ENCOUNTER — Encounter: Payer: Self-pay | Admitting: Emergency Medicine

## 2019-11-02 DIAGNOSIS — Z760 Encounter for issue of repeat prescription: Secondary | ICD-10-CM | POA: Diagnosis not present

## 2019-11-02 DIAGNOSIS — N529 Male erectile dysfunction, unspecified: Secondary | ICD-10-CM

## 2019-11-02 DIAGNOSIS — M25512 Pain in left shoulder: Secondary | ICD-10-CM

## 2019-11-02 MED ORDER — TRAMADOL HCL 50 MG PO TABS: 50.0000 mg | ORAL_TABLET | Freq: Four times a day (QID) | ORAL | 0 refills | Status: DC | PRN

## 2019-11-02 MED ORDER — SILDENAFIL CITRATE 100 MG PO TABS: 100.0000 mg | ORAL_TABLET | Freq: Every day | ORAL | 0 refills | Status: AC | PRN

## 2019-11-02 MED ORDER — CYCLOBENZAPRINE HCL 10 MG PO TABS: 10.0000 mg | ORAL_TABLET | Freq: Two times a day (BID) | ORAL | 0 refills | Status: DC | PRN

## 2019-11-02 NOTE — ED Triage Notes (Signed)
Patient states that he slipped off ladder x 2 days ago, trying to catch himself pulled the left shoulder. Had surgery on left shoulder in 1988- tendon/muscle repair. wants something for pain until he sees his regular doctor

## 2019-11-02 NOTE — ED Provider Notes (Signed)
[facility]   [id number] 11/02/19 Arrival Time: 1155  VE:LFYBO PAIN  SUBJECTIVE: History from: patient. Glenn Klein is a 49 y.o. male complains of left shoulder pain that began 2 days ago.  Reports that he slipped off of a ladder and caught himself with a stiff left arm.  Reports that he feels like something tore on the inside of his left shoulder.  Reports that he has history of an old injury that had to be surgically repaired.  Cannot recall what the injury was.  Localizes the pain to the anterior aspect of the left shoulder.  Describes the pain as constant and achy in character.  Has tried OTC medications without relief. Symptoms are made worse with activity. Denies similar symptoms in the past.  Denies fever, chills, erythema, ecchymosis, effusion, weakness, numbness and tingling, saddle paresthesias, loss of bowel or bladder function.      ROS: As per HPI.  All other pertinent ROS negative.     Past Medical History:  Diagnosis Date   Allergy    Asthma    Diabetes mellitus without complication (HCC)    Hyperlipidemia    Hypertension    Past Surgical History:  Procedure Laterality Date   SHOULDER SURGERY     WRIST SURGERY     Allergies  Allergen Reactions   Penicillins Anaphylaxis   No current facility-administered medications on file prior to encounter.   Current Outpatient Medications on File Prior to Encounter  Medication Sig Dispense Refill   acetaminophen (TYLENOL) 325 MG tablet Take 650 mg by mouth every 6 (six) hours as needed.     albuterol (VENTOLIN HFA) 108 (90 Base) MCG/ACT inhaler Inhale into the lungs every 6 (six) hours as needed for wheezing or shortness of breath.     aspirin 81 MG chewable tablet Chew by mouth daily.     atorvastatin (LIPITOR) 20 MG tablet Take 1 tablet (20 mg total) by mouth daily. 30 tablet 3   buprenorphine-naloxone (SUBOXONE) 8-2 mg SUBL SL tablet Place 1 tablet under the tongue daily.     cetirizine (ZYRTEC) 10  MG tablet Take 10 mg by mouth daily.     fluticasone (FLONASE) 50 MCG/ACT nasal spray Place into both nostrils daily.     hydrochlorothiazide (MICROZIDE) 12.5 MG capsule Take 1 capsule (12.5 mg total) by mouth daily. 30 capsule 3   losartan (COZAAR) 50 MG tablet Take 1 tablet (50 mg total) by mouth daily. 30 tablet 3   lubiprostone (AMITIZA) 24 MCG capsule TAKE 1 CAPSULE(24 MCG) BY MOUTH TWICE DAILY WITH A MEAL 60 capsule 0   metFORMIN (GLUCOPHAGE) 500 MG tablet Take 2 tablets (1,000 mg total) by mouth daily with breakfast. 60 tablet 3   omeprazole (PRILOSEC) 20 MG capsule Take 1 capsule (20 mg total) by mouth daily. 30 capsule 3   Psyllium (METAMUCIL PO) Take 1 tablet by mouth daily.     Social History   Socioeconomic History   Marital status: Single    Spouse name: Not on file   Number of children: Not on file   Years of education: Not on file   Highest education level: Not on file  Occupational History   Not on file  Tobacco Use   Smoking status: Current Some Day Smoker    Packs/day: 1.00    Years: 25.00    Pack years: 25.00    Types: Cigarettes    Last attempt to quit: 2014    Years since quitting: 7.7  Smokeless tobacco: Never Used   Tobacco comment: every once and a while  Vaping Use   Vaping Use: Every day   Substances: Nicotine, Flavoring  Substance and Sexual Activity   Alcohol use: No   Drug use: Not Currently   Sexual activity: Yes  Other Topics Concern   Not on file  Social History Narrative   Not on file   Social Determinants of Health   Financial Resource Strain:    Difficulty of Paying Living Expenses: Not on file  Food Insecurity:    Worried About Running Out of Food in the Last Year: Not on file   Ran Out of Food in the Last Year: Not on file  Transportation Needs:    Lack of Transportation (Medical): Not on file   Lack of Transportation (Non-Medical): Not on file  Physical Activity:    Days of Exercise per Week: Not on  file   Minutes of Exercise per Session: Not on file  Stress:    Feeling of Stress : Not on file  Social Connections:    Frequency of Communication with Friends and Family: Not on file   Frequency of Social Gatherings with Friends and Family: Not on file   Attends Religious Services: Not on file   Active Member of Clubs or Organizations: Not on file   Attends Banker Meetings: Not on file   Marital Status: Not on file  Intimate Partner Violence:    Fear of Current or Ex-Partner: Not on file   Emotionally Abused: Not on file   Physically Abused: Not on file   Sexually Abused: Not on file   Family History  Problem Relation Age of Onset   Hyperlipidemia Mother    Heart disease Mother    Colon polyps Mother    Heart disease Father    Colon polyps Maternal Grandmother    Heart disease Paternal Grandmother    Stroke Paternal Grandmother     OBJECTIVE:  Vitals:   11/02/19 1226  BP: 132/89  Pulse: 63  Resp: 16  Temp: 97.9 F (36.6 C)  SpO2: 98%    General appearance: ALERT; in no acute distress.  Head: NCAT Lungs: Normal respiratory effort CV: pulses 2+ bilaterally. Cap refill < 2 seconds Musculoskeletal:  Inspection: Skin warm, dry, clear and intact without obvious erythema, effusion, or ecchymosis.  Palpation: Anterior aspect of left shoulder tender to palpation ROM: Limited ROM active and passive to left shoulder Skin: warm and dry Neurologic: Ambulates without difficulty; Sensation intact about the upper/ lower extremities Psychological: alert and cooperative; normal mood and affect  DIAGNOSTIC STUDIES:  No results found.   ASSESSMENT & PLAN:  1. Acute pain of left shoulder   2. Medication refill   3. Erectile dysfunction, unspecified erectile dysfunction type      Meds ordered this encounter  Medications   sildenafil (VIAGRA) 100 MG tablet    Sig: Take 1 tablet (100 mg total) by mouth daily as needed for erectile  dysfunction.    Dispense:  10 tablet    Refill:  0    Order Specific Question:   Supervising Provider    Answer:   Merrilee Jansky [[id number]]   DISCONTD: traMADol (ULTRAM) 50 MG tablet    Sig: Take 1 tablet (50 mg total) by mouth every 6 (six) hours as needed.    Dispense:  15 tablet    Refill:  0    Order Specific Question:   Supervising Provider    Answer:  LAMPTEY, PHILIP O [[id number]]   cyclobenzaprine (FLEXERIL) 10 MG tablet    Sig: Take 1 tablet (10 mg total) by mouth 2 (two) times daily as needed for muscle spasms.    Dispense:  20 tablet    Refill:  0    Order Specific Question:   Supervising Provider    Answer:   Merrilee Jansky [[id number]]   Refilled Viagra Printed tramadol prescription Flexeril sent to pharmacy Continue conservative management of rest, ice, and gentle stretches Take ibuprofen as needed for pain relief (may cause abdominal discomfort, ulcers, and GI bleeds avoid taking with other NSAIDs) Take cyclobenzaprine at nighttime for symptomatic relief. Avoid driving or operating heavy machinery while using medication. Follow up with PCP if symptoms persist Return or go to the ER if you have any new or worsening symptoms (fever, chills, chest pain, abdominal pain, changes in bowel or bladder habits, pain radiating into lower legs)   [place] Controlled Substances Registry consulted for this patient. I feel the risk/benefit ratio today is favorable for proceeding with this prescription for a controlled substance. Medication sedation precautions given.  Reviewed expectations re: course of current medical issues. Questions answered. Outlined signs and symptoms indicating need for more acute intervention. Patient verbalized understanding. After Visit Summary given.       Moshe Cipro, NP 11/02/19 1524

## 2019-11-02 NOTE — Discharge Instructions (Addendum)
I have sent in Viagra 100mg  for you to take 30 minutes prior to sexual activity as needed  Take ibuprofen as needed.  Rest and elevate your arm.  Apply ice packs 2-3 times a day for up to 20 minutes each.     Printed tramadol prescription  Prescribed flexeril as needed  Follow up with your primary care provider or an orthopedist if you symptoms continue or worsen;  Or if you develop new symptoms, such as numbness, tingling, or weakness.

## 2019-11-04 ENCOUNTER — Other Ambulatory Visit: Payer: Self-pay

## 2019-11-04 NOTE — Progress Notes (Signed)
Error

## 2019-12-09 ENCOUNTER — Other Ambulatory Visit: Payer: Self-pay | Admitting: Internal Medicine

## 2019-12-09 DIAGNOSIS — I10 Essential (primary) hypertension: Secondary | ICD-10-CM

## 2019-12-09 NOTE — Telephone Encounter (Signed)
Requested Prescriptions  Pending Prescriptions Disp Refills  . hydrochlorothiazide (MICROZIDE) 12.5 MG capsule [Pharmacy Med Name: HYDROCHLOROTHIAZIDE 12.5 MG CAP] 90 capsule 0    Sig: TAKE 1 CAPSULE EVERY DAY     Cardiovascular: Diuretics - Thiazide Passed - 12/09/2019 12:29 PM      Passed - Ca in normal range and within 360 days    Calcium  Date Value Ref Range Status  10/11/2019 9.5 8.7 - 10.2 mg/dL Final         Passed - Cr in normal range and within 360 days    Creatinine, Ser  Date Value Ref Range Status  10/11/2019 0.79 0.76 - 1.27 mg/dL Final         Passed - K in normal range and within 360 days    Potassium  Date Value Ref Range Status  10/11/2019 4.4 3.5 - 5.2 mmol/L Final         Passed - Na in normal range and within 360 days    Sodium  Date Value Ref Range Status  10/11/2019 142 134 - 144 mmol/L Final         Passed - Last BP in normal range    BP Readings from Last 1 Encounters:  11/02/19 132/89         Passed - Valid encounter within last 6 months    Recent Outpatient Visits          2 months ago Chronic idiopathic constipation   [facility] Reubin Milan, MD   3 months ago Essential hypertension   [facility] Reubin Milan, MD             . losartan (COZAAR) 50 MG tablet [Pharmacy Med Name: LOSARTAN POTASSIUM 50 MG TAB] 90 tablet 0    Sig: TAKE ONE TABLET EVERY DAY     Cardiovascular:  Angiotensin Receptor Blockers Passed - 12/09/2019 12:29 PM      Passed - Cr in normal range and within 180 days    Creatinine, Ser  Date Value Ref Range Status  10/11/2019 0.79 0.76 - 1.27 mg/dL Final         Passed - K in normal range and within 180 days    Potassium  Date Value Ref Range Status  10/11/2019 4.4 3.5 - 5.2 mmol/L Final         Passed - Patient is not pregnant      Passed - Last BP in normal range    BP Readings from Last 1 Encounters:  11/02/19 132/89         Passed - Valid encounter within last 6  months    Recent Outpatient Visits          2 months ago Chronic idiopathic constipation   [facility] Reubin Milan, MD   3 months ago Essential hypertension   [facility] Reubin Milan, MD

## 2019-12-14 ENCOUNTER — Other Ambulatory Visit: Payer: Self-pay | Admitting: Internal Medicine

## 2019-12-14 DIAGNOSIS — E1169 Type 2 diabetes mellitus with other specified complication: Secondary | ICD-10-CM

## 2019-12-14 DIAGNOSIS — E118 Type 2 diabetes mellitus with unspecified complications: Secondary | ICD-10-CM

## 2019-12-14 DIAGNOSIS — K219 Gastro-esophageal reflux disease without esophagitis: Secondary | ICD-10-CM

## 2019-12-14 NOTE — Telephone Encounter (Signed)
Requested Prescriptions  Pending Prescriptions Disp Refills  . atorvastatin (LIPITOR) 20 MG tablet [Pharmacy Med Name: ATORVASTATIN CALCIUM 20 MG TAB] 30 tablet     Sig: TAKE ONE TABLET EVERY DAY     Cardiovascular:  Antilipid - Statins Failed - 12/14/2019 10:50 AM      Failed - Total Cholesterol in normal range and within 360 days    No results found for: CHOL, POCCHOL, CHOLTOT       Failed - LDL in normal range and within 360 days    No results found for: LDLCALC, LDLC, HIRISKLDL, POCLDL, LDLDIRECT, REALLDLC, TOTLDLC       Failed - HDL in normal range and within 360 days    No results found for: HDL, POCHDL       Failed - Triglycerides in normal range and within 360 days    No results found for: TRIG, POCTRIG       Passed - Patient is not pregnant      Passed - Valid encounter within last 12 months    Recent Outpatient Visits          2 months ago Chronic idiopathic constipation   [facility] Glean Hess, MD   3 months ago Essential hypertension   [facility] Glean Hess, MD             . metFORMIN (GLUCOPHAGE) 500 MG tablet [Pharmacy Med Name: METFORMIN HCL 500 MG TAB] 180 tablet 1    Sig: TAKE TWO TABLETS [place]     Endocrinology:  Diabetes - Biguanides Passed - 12/14/2019 10:50 AM      Passed - Cr in normal range and within 360 days    Creatinine, Ser  Date Value Ref Range Status  10/11/2019 0.79 0.76 - 1.27 mg/dL Final         Passed - HBA1C is between 0 and 7.9 and within 180 days    Hgb A1c MFr Bld  Date Value Ref Range Status  10/11/2019 6.1 (H) 4.8 - 5.6 % Final    Comment:             Prediabetes: 5.7 - 6.4          Diabetes: >6.4          Glycemic control for adults with diabetes: <7.0          Passed - eGFR in normal range and within 360 days    GFR calc Af Amer  Date Value Ref Range Status  10/11/2019 120 >59 mL/min/1.73 Final    Comment:    **[facility] currently reports eGFR in compliance with  the current**   recommendations of the [employer]. [facility] will   update reporting as new guidelines are published from the NKF-ASN   Task force.    GFR calc non Af Amer  Date Value Ref Range Status  10/11/2019 104 >59 mL/min/1.73 Final         Passed - Valid encounter within last 6 months    Recent Outpatient Visits          2 months ago Chronic idiopathic constipation   [facility] Glean Hess, MD   3 months ago Essential hypertension   [place], [place]             . omeprazole (PRILOSEC) 20 MG capsule [Pharmacy Med Name: OMEPRAZOLE DR 20 MG CAP] 90 capsule 0    Sig: TAKE 1  CAPSULE EVERY DAY     Gastroenterology: Proton Pump Inhibitors Passed - 12/14/2019 10:50 AM      Passed - Valid encounter within last 12 months    Recent Outpatient Visits          2 months ago Chronic idiopathic constipation   [facility] Glean Hess, MD   3 months ago Essential hypertension   [facility] Glean Hess, MD

## 2019-12-14 NOTE — Telephone Encounter (Signed)
Requested medication (s) are due for refill today: yes  Requested medication (s) are on the active medication list: yes  Last refill:  08/30/19  Future visit scheduled: no  Notes to clinic:  overdue lab work   Requested Prescriptions  Pending Prescriptions Disp Refills   atorvastatin (LIPITOR) 20 MG tablet [Pharmacy Med Name: ATORVASTATIN CALCIUM 20 MG TAB] 30 tablet     Sig: TAKE ONE TABLET EVERY DAY      Cardiovascular:  Antilipid - Statins Failed - 12/14/2019 10:50 AM      Failed - Total Cholesterol in normal range and within 360 days    No results found for: CHOL, POCCHOL, CHOLTOT        Failed - LDL in normal range and within 360 days    No results found for: LDLCALC, LDLC, HIRISKLDL, POCLDL, LDLDIRECT, REALLDLC, TOTLDLC        Failed - HDL in normal range and within 360 days    No results found for: HDL, POCHDL        Failed - Triglycerides in normal range and within 360 days    No results found for: TRIG, POCTRIG        Passed - Patient is not pregnant      Passed - Valid encounter within last 12 months    Recent Outpatient Visits           2 months ago Chronic idiopathic constipation   [facility] Glean Hess, MD   3 months ago Essential hypertension   [facility] Glean Hess, MD               Signed Prescriptions Disp Refills   metFORMIN (GLUCOPHAGE) 500 MG tablet 180 tablet 1    Sig: TAKE TWO TABLETS [place]      Endocrinology:  Diabetes - Biguanides Passed - 12/14/2019 10:50 AM      Passed - Cr in normal range and within 360 days    Creatinine, Ser  Date Value Ref Range Status  10/11/2019 0.79 0.76 - 1.27 mg/dL Final          Passed - HBA1C is between 0 and 7.9 and within 180 days    Hgb A1c MFr Bld  Date Value Ref Range Status  10/11/2019 6.1 (H) 4.8 - 5.6 % Final    Comment:             Prediabetes: 5.7 - 6.4          Diabetes: >6.4          Glycemic control for adults with diabetes:  <7.0           Passed - eGFR in normal range and within 360 days    GFR calc Af Amer  Date Value Ref Range Status  10/11/2019 120 >59 mL/min/1.73 Final    Comment:    **[facility] currently reports eGFR in compliance with the current**   recommendations of the [employer]. [facility] will   update reporting as new guidelines are published from the NKF-ASN   Task force.    GFR calc non Af Amer  Date Value Ref Range Status  10/11/2019 104 >59 mL/min/1.73 Final          Passed - Valid encounter within last 6 months    Recent Outpatient Visits           2 months ago Chronic idiopathic constipation   [facility] Glean Hess, MD  3 months ago Essential hypertension   [facility] Glean Hess, MD                omeprazole (PRILOSEC) 20 MG capsule 90 capsule 0    Sig: TAKE [place] DAY      Gastroenterology: Proton Pump Inhibitors Passed - 12/14/2019 10:50 AM      Passed - Valid encounter within last 12 months    Recent Outpatient Visits           2 months ago Chronic idiopathic constipation   [facility] Glean Hess, MD   3 months ago Essential hypertension   [facility] Glean Hess, MD

## 2019-12-20 ENCOUNTER — Telehealth: Payer: Self-pay | Admitting: Internal Medicine

## 2019-12-20 ENCOUNTER — Encounter: Payer: Self-pay | Admitting: Urology

## 2019-12-20 ENCOUNTER — Ambulatory Visit (INDEPENDENT_AMBULATORY_CARE_PROVIDER_SITE_OTHER): Payer: 59 | Admitting: Urology

## 2019-12-20 ENCOUNTER — Other Ambulatory Visit: Payer: Self-pay

## 2019-12-20 VITALS — BP 127/81 | HR 91 | Ht 67.0 in | Wt 186.0 lb

## 2019-12-20 DIAGNOSIS — N5201 Erectile dysfunction due to arterial insufficiency: Secondary | ICD-10-CM

## 2019-12-20 DIAGNOSIS — N529 Male erectile dysfunction, unspecified: Secondary | ICD-10-CM

## 2019-12-20 DIAGNOSIS — F524 Premature ejaculation: Secondary | ICD-10-CM | POA: Diagnosis not present

## 2019-12-20 MED ORDER — NONFORMULARY OR COMPOUNDED ITEM: 0 refills | Status: DC

## 2019-12-20 NOTE — Patient Instructions (Addendum)
Erectile Dysfunction Erectile dysfunction (ED) is the inability to get or keep an erection in order to have sexual intercourse. Erectile dysfunction may include:  Inability to get an erection.  Lack of enough hardness of the erection to allow penetration.  Loss of the erection before sex is finished. What are the causes? This condition may be caused by:  Certain medicines, such as: ? Pain relievers. ? Antihistamines. ? Antidepressants. ? Blood pressure medicines. ? Water pills (diuretics). ? Ulcer medicines. ? Muscle relaxants. ? Drugs.  Excessive drinking.  Psychological causes, such as: ? Anxiety. ? Depression. ? Sadness. ? Exhaustion. ? Performance fear. ? Stress.  Physical causes, such as: ? Artery problems. This may include diabetes, smoking, liver disease, or atherosclerosis. ? High blood pressure. ? Hormonal problems, such as low testosterone. ? Obesity. ? Nerve problems. This may include back or pelvic injuries, diabetes mellitus, multiple sclerosis, or Parkinson disease. What are the signs or symptoms? Symptoms of this condition include:  Inability to get an erection.  Lack of enough hardness of the erection to allow penetration.  Loss of the erection before sex is finished.  Normal erections at some times, but with frequent unsatisfactory episodes.  Low sexual satisfaction in either partner due to erection problems.  A curved penis occurring with erection. The curve may cause pain or the penis may be too curved to allow for intercourse.  Never having nighttime erections. How is this diagnosed? This condition is often diagnosed by:  Performing a physical exam to find other diseases or specific problems with the penis.  Asking you detailed questions about the problem.  Performing blood tests to check for diabetes mellitus or to measure hormone levels.  Performing other tests to check for underlying health conditions.  Performing an ultrasound  exam to check for scarring.  Performing a test to check blood flow to the penis.  Doing a sleep study at home to measure nighttime erections. How is this treated? This condition may be treated by:  Medicine taken by mouth to help you achieve an erection (oral medicine).  Hormone replacement therapy to replace low testosterone levels.  Medicine that is injected into the penis. Your health care provider may instruct you how to give yourself these injections at home.  Vacuum pump. This is a pump with a ring on it. The pump and ring are placed on the penis and used to create pressure that helps the penis become erect.  Penile implant surgery. In this procedure, you may receive: ? An inflatable implant. This consists of cylinders, a pump, and a reservoir. The cylinders can be inflated with a fluid that helps to create an erection, and they can be deflated after intercourse. ? A semi-rigid implant. This consists of two silicone rubber rods. The rods provide some rigidity. They are also flexible, so the penis can both curve downward in its normal position and become straight for sexual intercourse.  Blood vessel surgery, to improve blood flow to the penis. During this procedure, a blood vessel from a different part of the body is placed into the penis to allow blood to flow around (bypass) damaged or blocked blood vessels.  Lifestyle changes, such as exercising more, losing weight, and quitting smoking. Follow these instructions at home: Medicines   Take over-the-counter and prescription medicines only as told by your health care provider. Do not increase the dosage without first discussing it with your health care provider.  If you are using self-injections, perform injections as directed by your   health care provider. Make sure to avoid any veins that are on the surface of the penis. After giving an injection, apply pressure to the injection site for 5 minutes. General  instructions  Exercise regularly, as directed by your health care provider. Work with your health care provider to lose weight, if needed.  Do not use any products that contain nicotine or tobacco, such as cigarettes and e-cigarettes. If you need help quitting, ask your health care provider.  Before using a vacuum pump, read the instructions that come with the pump and discuss any questions with your health care provider.  Keep all follow-up visits as told by your health care provider. This is important. Contact a health care provider if:  You feel nauseous.  You vomit. Get help right away if:  You are taking oral or injectable medicines and you have an erection that lasts longer than 4 hours. If your health care provider is unavailable, go to the nearest emergency room for evaluation. An erection that lasts much longer than 4 hours can result in permanent damage to your penis.  You have severe pain in your groin or abdomen.  You develop redness or severe swelling of your penis.  You have redness spreading up into your groin or lower abdomen.  You are unable to urinate.  You experience chest pain or a rapid heart beat (palpitations) after taking oral medicines. Summary  Erectile dysfunction (ED) is the inability to get or keep an erection during sexual intercourse. This problem can usually be treated successfully.  This condition is diagnosed based on a physical exam, your symptoms, and tests to determine the cause. Treatment varies depending on the cause, and may include medicines, hormone therapy, surgery, or vacuum pump.  You may need follow-up visits to make sure that you are using your medicines or devices correctly.  Get help right away if you are taking or injecting medicines and you have an erection that lasts longer than 4 hours. This information is not intended to replace advice given to you by your health care provider. Make sure you discuss any questions you have with  your health care provider. Document Revised: 01/10/2017 Document Reviewed: 02/14/2016 Elsevier Patient Education  2020 Elsevier Inc.  

## 2019-12-20 NOTE — Telephone Encounter (Signed)
Placed referral for patient to [facility] urological.

## 2019-12-20 NOTE — Telephone Encounter (Signed)
Patient would like the doctor or nurse to call him regarding getting a penal injection or if she does not do that, he would like a referral for a urologist as soon as possible.  Patient stated that if he gets the referral, he would like to know where the doctor is referring him so he can call and speed up the appt.  Patient is concerned because per patient, this is causing problems with his current relationship.  Please advise and call patient to discuss at [phone]

## 2019-12-20 NOTE — Progress Notes (Signed)
   12/20/19 4:50 PM   Glenn Klein [birth year] [id number]  CC: Erectile dysfunction, premature ejaculation  HPI: I saw Glenn Klein today for the above issues.  He is a 49 year old male currently on long-term Suboxone who presents today with difficulty with erections over the last few years refractory to PDE 5 inhibitors.  He also has trouble with premature ejaculation.  He is extremely bothered by his current symptoms, as he recently started a new relationship.  He is convinced that the premature ejaculation is secondary to his Suboxone, but he is interested in other options for better erections, which he thinks may improve his PE.  He has tried max dose sildenafil 100 mg, as well as 20 mg Cialis on demand and has taken it appropriately without significant improvement in the erections.  Testosterone was checked by PCP on 10/11/2019 and was slightly low at 245, then repeated in September and was within the normal range at 311.  He apparently had a repeat testosterone checked again this morning with his PCP that is pending.  He denies any urinary symptoms aside from some mild frequency.  He is a smoker.  He is strongly interested in starting penile injections as soon as possible.   PMH: Past Medical History:  Diagnosis Date  . Allergy   . Asthma   . Diabetes mellitus without complication (HCC)   . Hyperlipidemia   . Hypertension     Surgical History: Past Surgical History:  Procedure Laterality Date  . SHOULDER SURGERY    . WRIST SURGERY      Family History: Family History  Problem Relation Age of Onset  . Hyperlipidemia Mother   . Heart disease Mother   . Colon polyps Mother   . Heart disease Father   . Colon polyps Maternal Grandmother   . Heart disease Paternal Grandmother   . Stroke Paternal Grandmother     Social History:  reports that he has been smoking cigarettes. He has a 25.00 pack-year smoking history. He has never used smokeless tobacco. He reports previous drug use.  He reports that he does not drink alcohol.  Physical Exam: BP 127/81 (BP Location: Left Arm, Patient Position: Sitting, Cuff Size: Normal)   Pulse 91   Ht 5\' 7"  (1.702 m)   Wt 186 lb (84.4 kg)   BMI 29.13 kg/m    Constitutional:  Alert and oriented, No acute distress. Cardiovascular: No clubbing, cyanosis, or edema. Respiratory: Normal respiratory effort, no increased work of breathing. GI: Abdomen is soft, nontender, nondistended, no abdominal masses  Laboratory Data: Reviewed, see HPI  Pertinent Imaging: None to review  Assessment & Plan:   In summary, is a 49 year old male with current Suboxone use and erectile dysfunction refractory to PDE 5 inhibitors, as well as premature ejaculation.  We discussed the close relationship between these 2 issues.  I recommended working on the erections first, and he strongly would like to pursue a trial of penile injections.  We discussed the risks, benefits, and alternatives at length including priapism.  We discussed that pending his trial with penile injections, we may need to think about other alternatives strategies for treating his premature ejaculation if still persistent including topical lidocaine cream, or even low-dose antidepressants.  Trimix test dose sent to pharmacy, follow-up with [facility] for cavernosal injection teaching   [facility], MD 12/20/2019  [facility] [address], [address] [place], [place] [id number]

## 2019-12-21 LAB — TESTOSTERONE: Testosterone: 332 ng/dL (ref 264–916)

## 2019-12-23 ENCOUNTER — Ambulatory Visit: Payer: Self-pay | Admitting: Urology

## 2019-12-27 NOTE — Progress Notes (Signed)
Mr. Ogborn presents today for a Trimix titration.  He is no longer spontaneous erections.   He has had moderate response to PDE5i's.    He denies any history of sickle cell anemia or trait, a history of multiple myeloma or a history of leukemia.   He has not taken trazodone or a PDE5i's today.    Patient's left corpus cavernosum is identified.  An area near the base of the penis is cleansed with rubbing alcohol.  Careful to avoid the dorsal vein, 2 mcg of Trimix (papaverine 30 mg, phentolamine 1 mg and prostaglandin E1 10 mcg, Lot # [id number]  exp # [id number]$) is injected at a 90 degree angle into the left corpus cavernosum near the base of the penis.  Patient experienced a semi firm erection in 15 minutes.    Patient's right corpus cavernosum is identified.  An area near the base of the penis is cleansed with rubbing alcohol.  Careful to avoid the dorsal vein, 2 mcg of Trimix (papaverine 30 mg, phentolamine 1 mg and prostaglandin E1 10 mcg, Lot # [id number]  exp # 01/24/2020) is injected at a 90 degree angle into the left corpus cavernosum near the base of the penis.  Patient experienced a very firm erection in 15 minutes.    I have also sent in a script for EMLA cream for his premature ejaculation.  Advised patient of the condition of priapism, painful erection lasting for more than four hours, and to contact the office immediately or seek treatment in the ED   I spent 20 minutes on the day of the encounter to include pre-visit record review, face-to-face time with the patient, and post-visit ordering of tests.

## 2019-12-28 ENCOUNTER — Other Ambulatory Visit: Payer: Self-pay

## 2019-12-28 ENCOUNTER — Telehealth: Payer: Self-pay | Admitting: Urology

## 2019-12-28 ENCOUNTER — Ambulatory Visit (INDEPENDENT_AMBULATORY_CARE_PROVIDER_SITE_OTHER): Payer: 59 | Admitting: Urology

## 2019-12-28 ENCOUNTER — Encounter: Payer: Self-pay | Admitting: Urology

## 2019-12-28 VITALS — BP 135/87 | HR 93 | Ht 69.0 in | Wt 186.0 lb

## 2019-12-28 DIAGNOSIS — N5201 Erectile dysfunction due to arterial insufficiency: Secondary | ICD-10-CM

## 2019-12-28 MED ORDER — LIDOCAINE-PRILOCAINE 2.5-2.5 % EX CREA: TOPICAL_CREAM | CUTANEOUS | 0 refills | Status: DC

## 2019-12-28 NOTE — Telephone Encounter (Signed)
Patient had an unsatisfactory erection after injecting 4 micrograms of Trimix.  We will instruct him to titrate up by 1 mcg.  So his next injection he will inject 5 mcg.

## 2019-12-28 NOTE — Telephone Encounter (Signed)
Yes there was more than one vial, he was super happy and said THANK YOU SHANNON.

## 2019-12-28 NOTE — Patient Instructions (Signed)

## 2019-12-28 NOTE — Telephone Encounter (Signed)
Ask him to check the bag he got from [facility] to see if they gave him more than one vial?

## 2019-12-28 NOTE — Telephone Encounter (Signed)
Spoke with patient and he will increase by , but per pt he only has enough for and needs a refill. Pt would like this done ASAP because he is going to [place] tomorrow.

## 2019-12-28 NOTE — Telephone Encounter (Signed)
.  left message to have patient return my call.  

## 2020-01-03 ENCOUNTER — Telehealth: Payer: Self-pay | Admitting: *Deleted

## 2020-01-03 DIAGNOSIS — N5201 Erectile dysfunction due to arterial insufficiency: Secondary | ICD-10-CM

## 2020-01-03 NOTE — Telephone Encounter (Signed)
Okay to refill Trimix.  He is injecting 6 mcg.

## 2020-01-03 NOTE — Telephone Encounter (Signed)
Pt calling asking for a refill of Trimix per pt he was told to increase to and he only have 1 dose left. Pt say's thank you for saving his relationship.

## 2020-01-04 MED ORDER — NONFORMULARY OR COMPOUNDED ITEM: 0 refills | Status: DC

## 2020-01-11 ENCOUNTER — Other Ambulatory Visit: Payer: Self-pay | Admitting: Internal Medicine

## 2020-01-11 DIAGNOSIS — I10 Essential (primary) hypertension: Secondary | ICD-10-CM

## 2020-01-14 ENCOUNTER — Other Ambulatory Visit: Payer: Self-pay | Admitting: Internal Medicine

## 2020-01-14 DIAGNOSIS — E1169 Type 2 diabetes mellitus with other specified complication: Secondary | ICD-10-CM

## 2020-01-14 DIAGNOSIS — K219 Gastro-esophageal reflux disease without esophagitis: Secondary | ICD-10-CM

## 2020-03-10 ENCOUNTER — Other Ambulatory Visit: Payer: Self-pay | Admitting: Internal Medicine

## 2020-03-10 DIAGNOSIS — I10 Essential (primary) hypertension: Secondary | ICD-10-CM

## 2020-03-21 ENCOUNTER — Other Ambulatory Visit: Payer: Self-pay | Admitting: Internal Medicine

## 2020-03-21 DIAGNOSIS — I10 Essential (primary) hypertension: Secondary | ICD-10-CM

## 2020-04-07 ENCOUNTER — Other Ambulatory Visit: Payer: Self-pay | Admitting: Internal Medicine

## 2020-04-07 DIAGNOSIS — I10 Essential (primary) hypertension: Secondary | ICD-10-CM

## 2020-04-18 ENCOUNTER — Other Ambulatory Visit: Payer: Self-pay

## 2020-04-18 ENCOUNTER — Telehealth: Payer: Self-pay | Admitting: Internal Medicine

## 2020-04-18 MED ORDER — PREDNISONE 10 MG PO TABS: 10.0000 mg | ORAL_TABLET | ORAL | 0 refills | Status: AC

## 2020-04-18 NOTE — Telephone Encounter (Signed)
Pt called and stated he was diagnosed with pneumonia on Friday 3.3.22 and he was prescribed 50mg  Prednisone and Z pack  and Pt wanted to know if Dr. can refill the prednisone due to him still having the congestion/ please advise

## 2020-04-18 NOTE — Telephone Encounter (Signed)
Called and spoke with patient. He went to [place]. [place] their number is [phone]. Said he was diagnosed with pneumonia and was given a Zpack along with 100 mg dose of prednisone, and then only 4 pills of 50 mg prednisone. He said he finished those but still feels like he is not well. Still feels slightly SOB, and wheezing. Dr. Judithann Graves agreed to send in a 6 day taper of prednisone for patient. Sent this in and told him if he does not get better then he will need to come see [place] for an appt.  He verbalized understanding of this.

## 2020-07-05 ENCOUNTER — Other Ambulatory Visit: Payer: Self-pay | Admitting: Internal Medicine

## 2020-07-05 ENCOUNTER — Telehealth: Payer: Self-pay

## 2020-07-05 DIAGNOSIS — I10 Essential (primary) hypertension: Secondary | ICD-10-CM

## 2020-07-05 NOTE — Telephone Encounter (Signed)
[facility] RN sent in 90 day refill for patient for both these meds today. Patient HAS to be seen before any more refills are given. Patient needs to come to scheduled appt on 06/10

## 2020-07-05 NOTE — Telephone Encounter (Signed)
Copied from CRM [id number]. Topic: General - Other >> Jul 05, 2020  3:25 PM Aretta Nip wrote: [facility] - [place], [place] - [address] [place]e [place] [address] Phone: [phone] Fax: [phone] Hours: Not open 24 hours  Pt has made the soonest appt available but needs enough meds to do to 6/10 of: losartan (COZAAR) 50 MG tablet 90 tablet 0 03/21/2020   Sig: TAKE 1 TABLET BY MOUTH DAILY hydrochlorothiazide (MICROZIDE) 12.5 MG capsule 90 capsule 0 03/10/2020   Sig: TAKE 1 CAPSULE BY MOUTH ONCE DAILY

## 2020-07-21 ENCOUNTER — Ambulatory Visit: Payer: 59 | Admitting: Internal Medicine

## 2020-07-21 NOTE — Progress Notes (Deleted)
Date:  07/21/2020   Name:  Glenn Klein   DOB:  29-Dec-1970   MRN:  [id number]   Chief Complaint: No chief complaint on file.  Diabetes He presents for his follow-up diabetic visit. He has type 2 diabetes mellitus. His disease course has been stable. Current diabetic treatment includes oral agent (monotherapy) (metformin). An ACE inhibitor/angiotensin II receptor blocker is being taken.  Hypertension This is a chronic problem. The problem is controlled. Past treatments include ACE inhibitors and diuretics. The current treatment provides significant improvement.  Hyperlipidemia This is a chronic problem. Current antihyperlipidemic treatment includes statins.   Lab Results  Component Value Date   CREATININE 0.79 10/11/2019   BUN 14 10/11/2019   NA 142 10/11/2019   K 4.4 10/11/2019   CL 101 10/11/2019   CO2 18 (L) 10/11/2019   No results found for: CHOL, HDL, LDLCALC, LDLDIRECT, TRIG, CHOLHDL No results found for: TSH Lab Results  Component Value Date   HGBA1C 6.1 (H) 10/11/2019   Lab Results  Component Value Date   WBC 8.8 10/11/2019   HGB 14.5 10/11/2019   HCT 42.7 10/11/2019   MCV 90 10/11/2019   PLT 305 10/11/2019   Lab Results  Component Value Date   ALT 21 10/11/2019   AST 26 10/11/2019   ALKPHOS 87 10/11/2019   BILITOT 0.4 10/11/2019     Review of Systems  Patient Active Problem List   Diagnosis Date Noted   Essential hypertension 08/23/2019   Type II diabetes mellitus with complication (HCC) 08/23/2019   Hyperlipidemia associated with type 2 diabetes mellitus (HCC) 08/23/2019   Erectile dysfunction 08/23/2019   Gastroesophageal reflux disease 08/23/2019   Hx of hepatitis C 08/23/2019   Narcotic dependence, in remission (HCC) 08/23/2019    Allergies  Allergen Reactions   Penicillins Anaphylaxis    Past Surgical History:  Procedure Laterality Date   SHOULDER SURGERY     WRIST SURGERY      Social History   Tobacco Use   Smoking status: Some  Days    Packs/day: 1.00    Years: 25.00    Pack years: 25.00    Types: Cigarettes    Last attempt to quit: 2014    Years since quitting: 8.4   Smokeless tobacco: Never   Tobacco comments:    every once and a while  Vaping Use   Vaping Use: Every day   Substances: Nicotine, Flavoring  Substance Use Topics   Alcohol use: No   Drug use: Not Currently     Medication list has been reviewed and updated.  No outpatient medications have been marked as taking for the 07/21/20 encounter (Appointment) with Reubin Milan, MD.    [facility] 2/9 Scores 08/23/2019  PHQ - 2 Score 0  PHQ- 9 Score 0    GAD 7 : Generalized Anxiety Score 08/23/2019  Nervous, Anxious, on Edge 0  Control/stop worrying 0  Worry too much - different things 0  Trouble relaxing 0  Restless 0  Easily annoyed or irritable 0  Afraid - awful might happen 0  Total GAD 7 Score 0  Anxiety Difficulty Not difficult at all    BP Readings from Last 3 Encounters:  12/28/19 135/87  12/20/19 127/81  11/02/19 132/89    Physical Exam  Wt Readings from Last 3 Encounters:  12/28/19 186 lb (84.4 kg)  12/20/19 186 lb (84.4 kg)  09/27/19 195 lb (88.5 kg)    There were no vitals taken for  this visit.  Assessment and Plan:

## 2020-08-10 ENCOUNTER — Ambulatory Visit: Payer: Medicaid Other | Admitting: Internal Medicine

## 2020-08-22 ENCOUNTER — Ambulatory Visit: Payer: Self-pay | Admitting: Internal Medicine

## 2020-08-29 ENCOUNTER — Encounter: Payer: Self-pay | Admitting: Internal Medicine

## 2020-08-30 ENCOUNTER — Telehealth: Payer: Self-pay

## 2020-08-30 DIAGNOSIS — N5201 Erectile dysfunction due to arterial insufficiency: Secondary | ICD-10-CM

## 2020-08-30 NOTE — Telephone Encounter (Signed)
Incoming call on triage VM in regards to patients Trimix RX. Patient states the dose he is prescribed is no longer effective. Pharmacy told patient he is on the lowest dose and the dose would have to be increased. A new rx will have to be sent to [facility]. Please advise.

## 2020-09-04 NOTE — Telephone Encounter (Signed)
Michiel Cowboy A, PA-C  You 5 minutes ago (1:55 PM)     He can just go ahead and try 7 mcg with his next injection.  Does he need a refill?

## 2020-09-04 NOTE — Telephone Encounter (Signed)
Pt calls triage line he states he is returning a call. He states that the trimix injections are not working for him and he wishes to increase the dose. He was told by the pharmacist at [facility] that he is receiving the lowest dose and could possibly be titrated up. Currently injecting 0.6cc. Please advise.

## 2020-09-04 NOTE — Telephone Encounter (Signed)
   I will need more information regarding his medication not working.  Is he referring to his Trimix injections?  If it is the Trimix he is referring to, I need to know how much he is injecting at this time?    Carman Ching, PA-C  McGowan, Shannon A, PA-C 3 days ago     Will defer to [employer].    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

## 2020-09-04 NOTE — Telephone Encounter (Signed)
.  left message to have patient return my call.  

## 2020-09-05 NOTE — Telephone Encounter (Signed)
.  left message to have patient return my call.  

## 2020-09-07 ENCOUNTER — Other Ambulatory Visit: Payer: Self-pay

## 2020-09-07 ENCOUNTER — Telehealth: Payer: Self-pay | Admitting: Internal Medicine

## 2020-09-07 DIAGNOSIS — I10 Essential (primary) hypertension: Secondary | ICD-10-CM

## 2020-09-07 MED ORDER — LOSARTAN POTASSIUM 50 MG PO TABS: 50.0000 mg | ORAL_TABLET | Freq: Every day | ORAL | 0 refills | Status: DC

## 2020-09-07 MED ORDER — HYDROCHLOROTHIAZIDE 12.5 MG PO CAPS: 12.5000 mg | ORAL_CAPSULE | Freq: Every day | ORAL | 0 refills | Status: DC

## 2020-09-07 NOTE — Telephone Encounter (Signed)
.  left message to have patient return my call.  

## 2020-09-07 NOTE — Telephone Encounter (Signed)
Patient mom in chart so I can speak with her about patient record. Patient mom Glenn Klein informed that patient has been dismissed from our clinic so he does need to find a new pcp in the next 30 days. Send in his 2 BP meds for 30 days, and told her we cannot refill this anymore after this.  She verbalized understanding and said she will tell her son- the patient.

## 2020-09-07 NOTE — Telephone Encounter (Signed)
Pts mom called about the discharge letter and stated that the pt asked if he can remain a pt/ he apologized for missing last appt and states he has been working in [place]/ please advise   Pt also needs a refill for both of his BP meds/ pts mom was not aware of name of medications / please advise when sent to pharmacy

## 2020-09-11 NOTE — Telephone Encounter (Signed)
Do you want me to send in a new RX for increased dose? What dose should it be?

## 2020-09-11 NOTE — Telephone Encounter (Signed)
Spoke to patient and he states he has titrated up to 1.0 and it is still not working. He wants to know if the RX can be changed to up the dose of medication? Please advise

## 2020-09-13 NOTE — Telephone Encounter (Signed)
Patient called office again to have the Trimix dose changed.

## 2020-09-14 MED ORDER — NONFORMULARY OR COMPOUNDED ITEM: 0 refills | Status: DC

## 2020-09-14 NOTE — Addendum Note (Signed)
Addended by: Honor Loh on: 09/14/2020 10:51 AM   Modules accepted: Orders

## 2020-09-14 NOTE — Telephone Encounter (Signed)
Trimix has been printed. I will fax the RX when he returns call to schedule the injection titration with Carollee Herter.

## 2020-09-20 ENCOUNTER — Other Ambulatory Visit: Payer: Self-pay | Admitting: Physician Assistant

## 2020-09-20 ENCOUNTER — Telehealth: Payer: Self-pay | Admitting: Physician Assistant

## 2020-09-20 NOTE — Telephone Encounter (Signed)
Patient returned call and scheduled appointment for Titration. RX for Trimix was faxed to [facility].

## 2020-09-20 NOTE — Telephone Encounter (Signed)
Patient called back today asking if we changed to dose of his trimix because the pharmacy stated that they have not received it. Please call him to discuss  Thanks, Marcelino Duster

## 2020-09-20 NOTE — Telephone Encounter (Signed)
Patient called triage line today requesting Trimix refill in advance of the weekend. I provided verbal authorization for refill with [facility], 55mL with 1 refill.

## 2020-09-29 ENCOUNTER — Telehealth: Payer: Self-pay

## 2020-09-29 MED ORDER — NONFORMULARY OR COMPOUNDED ITEM: 0 refills | Status: AC

## 2020-09-29 MED ORDER — NONFORMULARY OR COMPOUNDED ITEM: 0 refills | Status: DC

## 2020-09-29 NOTE — Telephone Encounter (Signed)
RX sent

## 2020-09-29 NOTE — Telephone Encounter (Signed)
ERROR-See previous encounter

## 2020-09-29 NOTE — Addendum Note (Signed)
Addended by: Frankey Shown on: 09/29/2020 04:13 PM   Modules accepted: Orders

## 2020-09-29 NOTE — Telephone Encounter (Signed)
Pt calls triage line and states that he accidentally left his new test dose vials in the car in the heat and now they are not salvageable. He requests the Trimix RX be sent in again. He also notes that he gets 5 vials for the same price as 3 and would prefer that 5 vials be sent in this time. He also questions if it is absolutely necessary he have an appointment for dose increase. Please advise.

## 2020-09-29 NOTE — Telephone Encounter (Signed)
That is fine to refill the Trimix.  He had the 30/1/20 potency.  He can have the 5 vials.

## 2020-10-07 ENCOUNTER — Other Ambulatory Visit: Payer: Self-pay | Admitting: Internal Medicine

## 2020-10-07 DIAGNOSIS — I10 Essential (primary) hypertension: Secondary | ICD-10-CM

## 2020-10-11 NOTE — Progress Notes (Incomplete)
   10/11/2020 8:05 AM  Glenn Klein May 22, 1970 [id number]   Referring provider: No referring provider defined for this encounter.   No chief complaint on file.  Urological History:  ED  - Testosterone checked 12/20/2019 was 332 - contributing factors of age, chronic opioid use, HTN, HLD, BPH and smoking -SHIM ***  -failed PDE5i's  Premature ejaculation - managed with EMLA cream   HPI:  Glenn Klein is a 50 y.o. male currently on long-term Suboxone with a history of erectile dysfunction and premature ejaculation who returns today for trimix titration.   He has had difficulty with erections over the last few years refractory to PDE 5 inhibitors. He also has a history of premature ejaculation. He has tried max dose of sildenafil 100 mg, as well as 20 mg Cialis which had no significant  improvement for his erections. His most recent testosterone was on 12/20/2019 and was within normal range of 332.   SHIM ***  Physical Exam: There were no vitals taken for this visit.  Constitutional:  Well nourished. Alert and oriented, No acute distress. GU: No CVA tenderness.  No bladder fullness or masses.  Patient with circumcised/uncircumcised phallus. ***Foreskin easily retracted***  Urethral meatus is patent.  No penile discharge. No penile lesions or rashes. Scrotum without lesions, cysts, rashes and/or edema.  Testicles are located scrotally bilaterally. No masses are appreciated in the testicles. Left and right epididymis are normal. Psychiatric: Normal mood and affect.   Procedure *** Patient's left corpus cavernosum is identified.  An area near the base of the penis is cleansed with rubbing alcohol.  Careful to avoid the dorsal vein, *** mcg of Trimix (papaverine 30 mg, phentolamine 1 mg and prostaglandin E1 10 mcg, Lot # ***@*** exp # *** is injected at a 90 degree angle into the left *** corpus cavernosum near the base of the penis.  Patient experienced a very firm erection in 15  minutes.     Patient's right corpus cavernosum is identified.  An area near the base of the penis is cleansed with rubbing alcohol.  Careful to avoid the dorsal vein, *** mcg of Trimix (papaverine 30 mg, phentolamine 1 mg and prostaglandin E1 10 mcg, Lot # ***@*** exp # *** is injected at a 90 degree angle into the left *** corpus cavernosum near the base of the penis.  Patient experienced a very firm erection in 15 minutes.    Advised patient of the condition of priapism, painful erection lasting for more than four hours, and to contact the office immediately or seek treatment in the ED   Assessment & Plan:    1. Erectile dysfunction 2. Premature ejaculation      No follow-ups on file.  Hulan Fray  [facility] [address] [place], [place] [address] [phone]  I,Kailey Littlejohn,acting as a scribe for [facility], PA-C.,have documented all relevant documentation on the behalf of SHANNON MCGOWAN, PA-C,as directed by  [facility], PA-C while in the presence of SHANNON MCGOWAN, PA-C.

## 2020-10-12 ENCOUNTER — Other Ambulatory Visit: Payer: Self-pay

## 2020-10-12 ENCOUNTER — Encounter: Payer: Self-pay | Admitting: Emergency Medicine

## 2020-10-12 ENCOUNTER — Ambulatory Visit: Payer: Self-pay | Admitting: Urology

## 2020-10-12 ENCOUNTER — Telehealth: Payer: Self-pay

## 2020-10-12 ENCOUNTER — Ambulatory Visit
Admission: EM | Admit: 2020-10-12 | Discharge: 2020-10-12 | Disposition: A | Discharge status: Home / Self Care | Payer: 59 | Attending: Family Medicine | Admitting: Family Medicine

## 2020-10-12 DIAGNOSIS — I10 Essential (primary) hypertension: Secondary | ICD-10-CM | POA: Insufficient documentation

## 2020-10-12 DIAGNOSIS — Z202 Contact with and (suspected) exposure to infections with a predominantly sexual mode of transmission: Secondary | ICD-10-CM | POA: Insufficient documentation

## 2020-10-12 MED ORDER — HYDROCHLOROTHIAZIDE 12.5 MG PO CAPS
12.5000 mg | ORAL_CAPSULE | Freq: Every day | ORAL | 0 refills | Status: AC
Start: 2020-10-12 — End: ?

## 2020-10-12 MED ORDER — ATORVASTATIN CALCIUM 20 MG PO TABS: 20.0000 mg | ORAL_TABLET | Freq: Every day | ORAL | 0 refills | Status: AC

## 2020-10-12 MED ORDER — METRONIDAZOLE 500 MG PO TABS: 2000.0000 mg | ORAL_TABLET | Freq: Once | ORAL | 0 refills | Status: AC

## 2020-10-12 MED ORDER — LOSARTAN POTASSIUM 50 MG PO TABS
50.0000 mg | ORAL_TABLET | Freq: Every day | ORAL | 0 refills | Status: AC
Start: 2020-10-12 — End: ?

## 2020-10-12 NOTE — Telephone Encounter (Signed)
Patient called stating he was going to be 45 minutes late. I called patient at 8:45 am to reschedule appointment as the trimix is a time sensitive injection. Pt rescheduled for sept 15. Advised patient it has to be 8am appt. Pt then states his gf was diagnosed with trichomonas and asked if he could still come in. As per shannon her schedule is booked and patient should go to the health department. Pt verbalized understanding.

## 2020-10-12 NOTE — Discharge Instructions (Addendum)
Medication as directed.  I have refilled your requested medication.  Take care  Dr. Adriana Simas

## 2020-10-12 NOTE — ED Provider Notes (Signed)
[facility]    CSN: [id number] Arrival date & time: 10/12/20  0908      History   Chief Complaint Chief Complaint  Patient presents with   Exposure to STD    HPI 50 year old male presents with the above complaint.  Patient states that his significant other tested positive for trichomonas.  Patient states that he desires testing and empiric treatment today.  He is currently asymptomatic.  He declines testing for other STDs.  Additionally, patient states that he no longer has a primary care physician.  He is running out of his blood pressure medication as well as his Lipitor.  He is requesting refills at this time.  BP mildly elevated today.  Past Medical History:  Diagnosis Date   Allergy    Asthma    Diabetes mellitus without complication (HCC)    Hyperlipidemia    Hypertension     Patient Active Problem List   Diagnosis Date Noted   Essential hypertension 08/23/2019   Type II diabetes mellitus with complication (HCC) 08/23/2019   Hyperlipidemia associated with type 2 diabetes mellitus (HCC) 08/23/2019   Erectile dysfunction 08/23/2019   Gastroesophageal reflux disease 08/23/2019   Hx of hepatitis C 08/23/2019   Narcotic dependence, in remission (HCC) 08/23/2019    Past Surgical History:  Procedure Laterality Date   SHOULDER SURGERY     WRIST SURGERY         Home Medications    Prior to Admission medications   Medication Sig Start Date End Date Taking? Authorizing Provider  aspirin 81 MG chewable tablet Chew by mouth daily.   Yes [provider]  metroNIDAZOLE (FLAGYL) 500 MG tablet Take 4 tablets (2,000 mg total) by mouth once for 1 dose. 10/12/20 10/12/20 Yes Quavis Klutz G, DO  albuterol (VENTOLIN HFA) 108 (90 Base) MCG/ACT inhaler Inhale into the lungs every 6 (six) hours as needed for wheezing or shortness of breath.    [provider]  atorvastatin (LIPITOR) 20 MG tablet Take 1 tablet (20 mg total) by mouth daily. 10/12/20   Tommie Sams, DO  hydrochlorothiazide (MICROZIDE) 12.5 MG capsule Take 1 capsule (12.5 mg total) by mouth daily. 10/12/20   Tommie Sams, DO  losartan (COZAAR) 50 MG tablet Take 1 tablet (50 mg total) by mouth daily. 10/12/20   Tommie Sams, DO  NONFORMULARY OR COMPOUNDED ITEM Trimix (30/1/20)-(Pap/Phent/PGE)  Test Dose  70ml vial   Qty #5 Refills 0  [facility] [phone] Fax [phone] 09/29/20   Michiel Cowboy A, PA-C  sildenafil (VIAGRA) 100 MG tablet Take 1 tablet (100 mg total) by mouth daily as needed for erectile dysfunction. 11/02/19   Moshe Cipro, NP    Family History Family History  Problem Relation Age of Onset   Hyperlipidemia Mother    Heart disease Mother    Colon polyps Mother    Heart disease Father    Colon polyps Maternal Grandmother    Heart disease Paternal Grandmother    Stroke Paternal Grandmother     Social History Social History   Tobacco Use   Smoking status: Every Day    Packs/day: 1.00    Years: 25.00    Pack years: 25.00    Types: Cigarettes    Last attempt to quit: 2014    Years since quitting: 8.6   Smokeless tobacco: Never   Tobacco comments:    every once and a while  Vaping Use   Vaping Use: Former   Substances:  Nicotine, Flavoring  Substance Use Topics   Alcohol use: Yes   Drug use: Not Currently     Allergies   Penicillins   Review of Systems Review of Systems  Constitutional:        Sweating.  Genitourinary: Negative.     Physical Exam Triage Vital Signs ED Triage Vitals  Enc Vitals Group     BP 10/12/20 1004 (!) 141/97     Pulse Rate 10/12/20 1004 88     Resp 10/12/20 1004 18     Temp 10/12/20 1004 97.7 F (36.5 C)     Temp Source 10/12/20 1004 Oral     SpO2 10/12/20 1004 98 %     Weight 10/12/20 1000 186 lb 1.1 oz (84.4 kg)     Height 10/12/20 1000 5\' 9"  (1.753 m)     Head Circumference --      Peak Flow --      Pain Score 10/12/20 1000 0     Pain Loc --      Pain Edu? --      Excl. in  GC? --     Updated Vital Signs BP (!) 141/97 (BP Location: Left Arm)   Pulse 88   Temp 97.7 F (36.5 C) (Oral)   Resp 18   Ht 5\' 9"  (1.753 m)   Wt 84.4 kg   SpO2 98%   BMI 27.48 kg/m   Visual Acuity Right Eye Distance:   Left Eye Distance:   Bilateral Distance:    Right Eye Near:   Left Eye Near:    Bilateral Near:     Physical Exam Constitutional:      General: He is not in acute distress.    Appearance: Normal appearance. He is not ill-appearing.  HENT:     Head: Normocephalic and atraumatic.  Eyes:     General:        Right eye: No discharge.        Left eye: No discharge.     Conjunctiva/sclera: Conjunctivae normal.  Cardiovascular:     Rate and Rhythm: Normal rate and regular rhythm.  Pulmonary:     Effort: Pulmonary effort is normal.     Breath sounds: Normal breath sounds. No wheezing, rhonchi or rales.  Neurological:     Mental Status: He is alert.   UC Treatments / Results  Labs (all labs ordered are listed, but only abnormal results are displayed) Labs Reviewed  TRICHOMONAS VAGINALIS, PROBE AMP    EKG   Radiology No results found.  Procedures Procedures (including critical care time)  Medications Ordered in UC Medications - No data to display  Initial Impression / Assessment and Plan / UC Course  I have reviewed the triage vital signs and the nursing notes.  Pertinent labs & imaging results that were available during my care of the patient were reviewed by me and considered in my medical decision making (see chart for details).    50 year old male presents with exposure to trichomonas.  Awaiting test results.  Patient desires empiric treatment.  Treating with Flagyl.  Additionally, patient requested his blood pressure medication to be refilled as he is currently running out.  Also, requested refill on Lipitor.  Prescriptions sent to the pharmacy.  Supportive care.  Final Clinical Impressions(s) / UC Diagnoses   Final diagnoses:   Essential hypertension  Exposure to STD     Discharge Instructions      Medication as directed.  I have refilled your requested medication.  Take care  Dr. Adriana Simas    ED Prescriptions     Medication Sig Dispense Auth. Provider   losartan (COZAAR) 50 MG tablet Take 1 tablet (50 mg total) by mouth daily. 90 tablet Hani Campusano G, DO   hydrochlorothiazide (MICROZIDE) 12.5 MG capsule Take 1 capsule (12.5 mg total) by mouth daily. 90 capsule Dorell Gatlin G, DO   atorvastatin (LIPITOR) 20 MG tablet Take 1 tablet (20 mg total) by mouth daily. 90 tablet Demetrus Pavao G, DO   metroNIDAZOLE (FLAGYL) 500 MG tablet Take 4 tablets (2,000 mg total) by mouth once for 1 dose. 4 tablet Tommie Sams, DO      PDMP not reviewed this encounter.   Tommie Sams, [place] 10/12/20 1137

## 2020-10-12 NOTE — ED Triage Notes (Signed)
Pt presents to [facility] for std testing and treatment. He states his girlfriend has trichomonas. Pt is asymptomatic. He is not interested in being tested for other stds. He also states he does not have a PCP right now and would like a refill on his BP medications.

## 2020-10-15 LAB — TRICHOMONAS VAGINALIS, PROBE AMP: Trich vag by NAA: NEGATIVE

## 2020-10-25 NOTE — Progress Notes (Incomplete)
   10/25/2020 8:00 AM  Glenn Klein 1970-03-01 [id number]   Referring provider: No referring provider defined for this encounter.   No chief complaint on file.   HPI: Glenn Klein is a 50 y.o. male with erectile dysfunction, who presents today for Trimix titration. He previously had moderate response to PDE5i's. He denies history of sickle cell anemia or trait, a history of multiple myeloma or leukemia.      Physical Exam:  There were no vitals taken for this visit.  Constitutional:  Well nourished. Alert and oriented, No acute distress. GU: No CVA tenderness.  No bladder fullness or masses.  Patient with circumcised/uncircumcised phallus. ***Foreskin easily retracted***  Urethral meatus is patent.  No penile discharge. No penile lesions or rashes. Scrotum without lesions, cysts, rashes and/or edema.  Testicles are located scrotally bilaterally. No masses are appreciated in the testicles. Left and right epididymis are normal. Psychiatric: Normal mood and affect.   Procedure *** Patient's left corpus cavernosum is identified.  An area near the base of the penis is cleansed with rubbing alcohol.  Careful to avoid the dorsal vein, *** mcg of Trimix (papaverine 30 mg, phentolamine 1 mg and prostaglandin E1 10 mcg, Lot # ***@*** exp # *** is injected at a 90 degree angle into the left *** corpus cavernosum near the base of the penis.  Patient experienced a very firm erection in 15 minutes.      Patient's right corpus cavernosum is identified.  An area near the base of the penis is cleansed with rubbing alcohol.  Careful to avoid the dorsal vein, *** mcg of Trimix (papaverine 30 mg, phentolamine 1 mg and prostaglandin E1 10 mcg, Lot # ***@*** exp # *** is injected at a 90 degree angle into the left *** corpus cavernosum near the base of the penis.  Patient experienced a very firm erection in 15 minutes.     Assessment & Plan:    1. Erectile dysfunction  2. Premature ejaculation        No follow-ups on file.  Glenn Klein  [facility] [address] [place], [place] [address] [phone]  I,Glenn Klein,acting as a scribe for [facility], PA-C.,have documented all relevant documentation on the behalf of [place], PA-C,as directed by  [facility], PA-C while in the presence of SHANNON MCGOWAN, PA-C.

## 2020-10-26 ENCOUNTER — Ambulatory Visit: Payer: Medicaid Other | Admitting: Urology

## 2020-10-26 DIAGNOSIS — N5201 Erectile dysfunction due to arterial insufficiency: Secondary | ICD-10-CM

## 2020-10-27 ENCOUNTER — Encounter: Payer: Self-pay | Admitting: Urology

## 2021-01-08 ENCOUNTER — Other Ambulatory Visit: Payer: Self-pay | Admitting: Family Medicine

## 2021-01-25 ENCOUNTER — Other Ambulatory Visit: Payer: Self-pay | Admitting: Family Medicine

## 2021-01-25 DIAGNOSIS — I10 Essential (primary) hypertension: Secondary | ICD-10-CM

## 2021-04-20 NOTE — Progress Notes (Incomplete)
04/20/21 ?4:05 PM  ? ?Shiela Mayer ?November 14, 1970 ?[id number] ? ?Referring provider:  ?No referring provider defined for this encounter. ?No chief complaint on file. ? ? ?Urological history  ?Erectile dysfunction  ?- Previously tried on Suboxone and refractory to PDE 5 inhibitors  ?- Managed on Trimix last injection was in 2021 ? ?2. Premature ejaculation ?- Secondary to Suboxone  ? ?HPI: ?Glenn Klein is a 51 y.o.male who presents today for discussion on Trimix for erectile dysfunction.  ? ? ? ? ? ?PMH: ?Past Medical History:  ?Diagnosis Date  ? Allergy   ? Asthma   ? Diabetes mellitus without complication (HCC)   ? Hyperlipidemia   ? Hypertension   ? ? ?Surgical History: ?Past Surgical History:  ?Procedure Laterality Date  ? SHOULDER SURGERY    ? WRIST SURGERY    ? ? ?Home Medications:  ?Allergies as of 04/23/2021   ? ?   Reactions  ? Penicillins Anaphylaxis  ? ?  ? ?  ?Medication List  ?  ? ?  ? Accurate as of April 20, 2021  4:05 PM. If you have any questions, ask your nurse or doctor.  ?  ?  ? ?  ? ?albuterol 108 (90 Base) MCG/ACT inhaler ?Commonly known as: VENTOLIN HFA ?Inhale into the lungs every 6 (six) hours as needed for wheezing or shortness of breath. ?  ?aspirin 81 MG chewable tablet ?Chew by mouth daily. ?  ?atorvastatin 20 MG tablet ?Commonly known as: LIPITOR ?Take 1 tablet (20 mg total) by mouth daily. ?  ?hydrochlorothiazide 12.5 MG capsule ?Commonly known as: MICROZIDE ?Take 1 capsule (12.5 mg total) by mouth daily. ?  ?losartan 50 MG tablet ?Commonly known as: COZAAR ?Take 1 tablet (50 mg total) by mouth daily. ?  ?NONFORMULARY OR COMPOUNDED ITEM ?Trimix (30/1/20)-(Pap/Phent/PGE) ? ?Test Dose  36ml vial  ? ?Qty #5 Refills 0 ? ?[facility] ?[phone] ?Fax [phone] ?  ?sildenafil 100 MG tablet ?Commonly known as: Viagra ?Take 1 tablet (100 mg total) by mouth daily as needed for erectile dysfunction. ?  ? ?  ? ? ?Allergies:  ?Allergies  ?Allergen Reactions  ? Penicillins Anaphylaxis   ? ? ?Family History: ?Family History  ?Problem Relation Age of Onset  ? Hyperlipidemia Mother   ? Heart disease Mother   ? Colon polyps Mother   ? Heart disease Father   ? Colon polyps Maternal Grandmother   ? Heart disease Paternal Grandmother   ? Stroke Paternal Grandmother   ? ? ?Social History:  reports that he has been smoking cigarettes. He has a 25.00 pack-year smoking history. He has never used smokeless tobacco. He reports current alcohol use. He reports that he does not currently use drugs. ? ? ?Physical Exam: ?There were no vitals taken for this visit.  ?Constitutional:  Alert and oriented, No acute distress. ?HEENT: [place] AT, moist mucus membranes.  Trachea midline, no masses. ?Cardiovascular: No clubbing, cyanosis, or edema. ?Respiratory: Normal respiratory effort, no increased work of breathing. ?GI: Abdomen is soft, nontender, nondistended, no abdominal masses ?GU: No CVA tenderness ?Lymph: No cervical or inguinal lymphadenopathy. ?Skin: No rashes, bruises or suspicious lesions. ?Neurologic: Grossly intact, no focal deficits, moving all 4 extremities. ?Psychiatric: Normal mood and affect. ? ? ?Laboratory Data: ?Lab Results  ?Component Value Date  ? CREATININE 0.79 10/11/2019  ? ?Lab Results  ?Component Value Date  ? HGBA1C 6.1 (H) 10/11/2019  ? ? ?Urinalysis ? ? ?Pertinent Imaging: ? ? ?Assessment & Plan:   ? ? ?  No follow-ups on file. ? ?[facility] ?[address], [address] ?[place], [place] [address] ?[phone] ? ?I,Kailey Littlejohn,acting as a Neurosurgeon for [employer], PA-C.,have documented all relevant documentation on the behalf of SHANNON MCGOWAN, PA-C,as directed by  [facility], PA-C while in the presence of SHANNON MCGOWAN, PA-C. ? ?

## 2021-04-23 ENCOUNTER — Ambulatory Visit: Payer: Medicaid Other | Admitting: Urology

## 2021-06-24 ENCOUNTER — Other Ambulatory Visit: Payer: Self-pay | Admitting: Internal Medicine

## 2021-06-24 DIAGNOSIS — I10 Essential (primary) hypertension: Secondary | ICD-10-CM
